# Patient Record
Sex: Female | Born: 1950
Health system: Southern US, Community
[De-identification: ages and names within clinical notes are randomized; demographics above are authoritative.]

## PROBLEM LIST (undated history)

## (undated) DIAGNOSIS — D8989 Other specified disorders involving the immune mechanism, not elsewhere classified: Secondary | ICD-10-CM

## (undated) DIAGNOSIS — I480 Paroxysmal atrial fibrillation: Secondary | ICD-10-CM

## (undated) DIAGNOSIS — N809 Endometriosis, unspecified: Secondary | ICD-10-CM

## (undated) DIAGNOSIS — F419 Anxiety disorder, unspecified: Secondary | ICD-10-CM

## (undated) DIAGNOSIS — C801 Malignant (primary) neoplasm, unspecified: Secondary | ICD-10-CM

## (undated) DIAGNOSIS — R5382 Chronic fatigue, unspecified: Secondary | ICD-10-CM

## (undated) DIAGNOSIS — G9332 Myalgic encephalomyelitis/chronic fatigue syndrome: Secondary | ICD-10-CM

## (undated) DIAGNOSIS — F32A Depression, unspecified: Secondary | ICD-10-CM

## (undated) DIAGNOSIS — G43909 Migraine, unspecified, not intractable, without status migrainosus: Secondary | ICD-10-CM

## (undated) DIAGNOSIS — I4719 Other supraventricular tachycardia: Secondary | ICD-10-CM

## (undated) DIAGNOSIS — M1990 Unspecified osteoarthritis, unspecified site: Secondary | ICD-10-CM

## (undated) DIAGNOSIS — I471 Supraventricular tachycardia: Secondary | ICD-10-CM

## (undated) DIAGNOSIS — E1140 Type 2 diabetes mellitus with diabetic neuropathy, unspecified: Secondary | ICD-10-CM

## (undated) DIAGNOSIS — C4490 Unspecified malignant neoplasm of skin, unspecified: Secondary | ICD-10-CM

## (undated) DIAGNOSIS — I10 Essential (primary) hypertension: Secondary | ICD-10-CM

## (undated) DIAGNOSIS — Z8781 Personal history of (healed) traumatic fracture: Secondary | ICD-10-CM

## (undated) DIAGNOSIS — E119 Type 2 diabetes mellitus without complications: Secondary | ICD-10-CM

## (undated) DIAGNOSIS — T7840XA Allergy, unspecified, initial encounter: Secondary | ICD-10-CM

## (undated) DIAGNOSIS — M797 Fibromyalgia: Secondary | ICD-10-CM

## (undated) DIAGNOSIS — K219 Gastro-esophageal reflux disease without esophagitis: Secondary | ICD-10-CM

## (undated) HISTORY — DX: Other supraventricular tachycardia: I47.19

## (undated) HISTORY — PX: BREAST LUMPECTOMY: SHX2

## (undated) HISTORY — DX: Malignant (primary) neoplasm, unspecified: C80.1

## (undated) HISTORY — DX: Other specified disorders involving the immune mechanism, not elsewhere classified: D89.89

## (undated) HISTORY — DX: Paroxysmal atrial fibrillation: I48.0

## (undated) HISTORY — DX: Supraventricular tachycardia: I47.1

## (undated) HISTORY — DX: Type 2 diabetes mellitus with diabetic neuropathy, unspecified: E11.40

## (undated) HISTORY — DX: Allergy, unspecified, initial encounter: T78.40XA

## (undated) HISTORY — DX: Unspecified malignant neoplasm of skin, unspecified: C44.90

## (undated) HISTORY — PX: ABDOMINAL HYSTERECTOMY: SHX81

## (undated) HISTORY — PX: DG  BONE DENSITY (ARMC HX): HXRAD1102

## (undated) HISTORY — DX: Essential (primary) hypertension: I10

## (undated) HISTORY — PX: PARTIAL HYSTERECTOMY: SHX80

## (undated) HISTORY — DX: Personal history of (healed) traumatic fracture: Z87.81

## (undated) HISTORY — DX: Fibromyalgia: M79.7

## (undated) HISTORY — DX: Depression, unspecified: F32.A

## (undated) HISTORY — DX: Anxiety disorder, unspecified: F41.9

## (undated) HISTORY — PX: BREAST BIOPSY: SHX20

## (undated) HISTORY — DX: Type 2 diabetes mellitus without complications: E11.9

## (undated) HISTORY — DX: Unspecified osteoarthritis, unspecified site: M19.90

## (undated) HISTORY — DX: Endometriosis, unspecified: N80.9

## (undated) HISTORY — DX: Gastro-esophageal reflux disease without esophagitis: K21.9

## (undated) HISTORY — DX: Myalgic encephalomyelitis/chronic fatigue syndrome: G93.32

## (undated) HISTORY — DX: Chronic fatigue, unspecified: R53.82

## (undated) HISTORY — DX: Migraine, unspecified, not intractable, without status migrainosus: G43.909

---

## 1997-10-28 ENCOUNTER — Ambulatory Visit (HOSPITAL_COMMUNITY): Admission: RE | Admit: 1997-10-28 | Discharge: 1997-10-28 | Payer: Self-pay | Admitting: Obstetrics and Gynecology

## 1998-08-26 ENCOUNTER — Other Ambulatory Visit: Admission: RE | Admit: 1998-08-26 | Discharge: 1998-08-26 | Payer: Self-pay | Admitting: Obstetrics and Gynecology

## 2000-09-27 HISTORY — PX: ABLATION: SHX5711

## 2000-12-07 ENCOUNTER — Encounter (HOSPITAL_COMMUNITY): Admission: RE | Admit: 2000-12-07 | Discharge: 2001-03-07 | Payer: Self-pay | Admitting: Family Medicine

## 2001-03-10 ENCOUNTER — Encounter (HOSPITAL_COMMUNITY): Admission: RE | Admit: 2001-03-10 | Discharge: 2001-04-26 | Payer: Self-pay | Admitting: Family Medicine

## 2001-04-27 ENCOUNTER — Encounter (HOSPITAL_COMMUNITY): Admission: RE | Admit: 2001-04-27 | Discharge: 2001-07-26 | Payer: Self-pay | Admitting: Family Medicine

## 2001-07-28 ENCOUNTER — Encounter (HOSPITAL_COMMUNITY): Admission: RE | Admit: 2001-07-28 | Discharge: 2001-08-26 | Payer: Self-pay | Admitting: Family Medicine

## 2001-08-01 ENCOUNTER — Encounter: Payer: Self-pay | Admitting: Obstetrics and Gynecology

## 2001-08-01 ENCOUNTER — Encounter: Admission: RE | Admit: 2001-08-01 | Discharge: 2001-08-01 | Payer: Self-pay | Admitting: Obstetrics and Gynecology

## 2001-10-23 ENCOUNTER — Encounter: Admission: RE | Admit: 2001-10-23 | Discharge: 2001-10-23 | Payer: Self-pay | Admitting: Obstetrics and Gynecology

## 2001-10-23 ENCOUNTER — Encounter: Payer: Self-pay | Admitting: Obstetrics and Gynecology

## 2001-11-01 ENCOUNTER — Other Ambulatory Visit: Admission: RE | Admit: 2001-11-01 | Discharge: 2001-11-01 | Payer: Self-pay | Admitting: Obstetrics and Gynecology

## 2001-11-06 ENCOUNTER — Encounter: Admission: RE | Admit: 2001-11-06 | Discharge: 2001-11-06 | Payer: Self-pay | Admitting: Obstetrics and Gynecology

## 2001-11-06 ENCOUNTER — Encounter: Payer: Self-pay | Admitting: Obstetrics and Gynecology

## 2001-11-17 ENCOUNTER — Encounter: Admission: RE | Admit: 2001-11-17 | Discharge: 2001-11-17 | Payer: Self-pay | Admitting: Family Medicine

## 2001-11-17 ENCOUNTER — Encounter: Payer: Self-pay | Admitting: Family Medicine

## 2002-04-03 ENCOUNTER — Encounter: Admission: RE | Admit: 2002-04-03 | Discharge: 2002-04-03 | Payer: Self-pay | Admitting: Obstetrics and Gynecology

## 2002-04-03 ENCOUNTER — Encounter: Payer: Self-pay | Admitting: Obstetrics and Gynecology

## 2003-04-25 ENCOUNTER — Encounter: Payer: Self-pay | Admitting: Family Medicine

## 2003-04-25 ENCOUNTER — Encounter: Admission: RE | Admit: 2003-04-25 | Discharge: 2003-04-25 | Payer: Self-pay | Admitting: Family Medicine

## 2003-04-29 ENCOUNTER — Ambulatory Visit (HOSPITAL_COMMUNITY): Admission: RE | Admit: 2003-04-29 | Discharge: 2003-04-29 | Payer: Self-pay | Admitting: Obstetrics and Gynecology

## 2003-04-29 ENCOUNTER — Encounter: Payer: Self-pay | Admitting: Obstetrics and Gynecology

## 2003-06-11 ENCOUNTER — Other Ambulatory Visit: Admission: RE | Admit: 2003-06-11 | Discharge: 2003-06-11 | Payer: Self-pay | Admitting: Obstetrics and Gynecology

## 2004-06-02 ENCOUNTER — Encounter: Admission: RE | Admit: 2004-06-02 | Discharge: 2004-06-02 | Payer: Self-pay | Admitting: Gastroenterology

## 2004-06-05 ENCOUNTER — Ambulatory Visit (HOSPITAL_COMMUNITY): Admission: RE | Admit: 2004-06-05 | Discharge: 2004-06-05 | Payer: Self-pay | Admitting: Gastroenterology

## 2004-07-22 ENCOUNTER — Other Ambulatory Visit: Admission: RE | Admit: 2004-07-22 | Discharge: 2004-07-22 | Payer: Self-pay | Admitting: Obstetrics and Gynecology

## 2004-08-03 ENCOUNTER — Ambulatory Visit (HOSPITAL_COMMUNITY): Admission: RE | Admit: 2004-08-03 | Discharge: 2004-08-03 | Payer: Self-pay | Admitting: Obstetrics and Gynecology

## 2004-09-01 ENCOUNTER — Encounter: Admission: RE | Admit: 2004-09-01 | Discharge: 2004-09-01 | Payer: Self-pay | Admitting: Obstetrics and Gynecology

## 2004-11-02 ENCOUNTER — Encounter: Admission: RE | Admit: 2004-11-02 | Discharge: 2004-11-02 | Payer: Self-pay | Admitting: Gastroenterology

## 2004-11-27 ENCOUNTER — Encounter: Admission: RE | Admit: 2004-11-27 | Discharge: 2004-11-27 | Payer: Self-pay | Admitting: *Deleted

## 2004-12-03 ENCOUNTER — Observation Stay (HOSPITAL_COMMUNITY): Admission: RE | Admit: 2004-12-03 | Discharge: 2004-12-04 | Payer: Self-pay | Admitting: *Deleted

## 2005-07-26 ENCOUNTER — Other Ambulatory Visit: Admission: RE | Admit: 2005-07-26 | Discharge: 2005-07-26 | Payer: Self-pay | Admitting: Obstetrics and Gynecology

## 2007-03-02 ENCOUNTER — Other Ambulatory Visit: Admission: RE | Admit: 2007-03-02 | Discharge: 2007-03-02 | Payer: Self-pay | Admitting: Obstetrics and Gynecology

## 2007-04-26 ENCOUNTER — Encounter: Admission: RE | Admit: 2007-04-26 | Discharge: 2007-04-26 | Payer: Self-pay | Admitting: Obstetrics and Gynecology

## 2008-04-26 ENCOUNTER — Encounter: Admission: RE | Admit: 2008-04-26 | Discharge: 2008-04-26 | Payer: Self-pay | Admitting: Obstetrics and Gynecology

## 2009-04-28 ENCOUNTER — Encounter: Admission: RE | Admit: 2009-04-28 | Discharge: 2009-04-28 | Payer: Self-pay | Admitting: Obstetrics and Gynecology

## 2009-05-14 ENCOUNTER — Other Ambulatory Visit: Admission: RE | Admit: 2009-05-14 | Discharge: 2009-05-14 | Payer: Self-pay | Admitting: Obstetrics and Gynecology

## 2009-07-24 ENCOUNTER — Encounter: Admission: RE | Admit: 2009-07-24 | Discharge: 2009-07-24 | Payer: Self-pay | Admitting: Family Medicine

## 2010-04-29 ENCOUNTER — Encounter: Admission: RE | Admit: 2010-04-29 | Discharge: 2010-04-29 | Payer: Self-pay | Admitting: Obstetrics and Gynecology

## 2010-05-08 ENCOUNTER — Encounter: Admission: RE | Admit: 2010-05-08 | Discharge: 2010-05-08 | Payer: Self-pay | Admitting: Obstetrics and Gynecology

## 2010-09-18 ENCOUNTER — Other Ambulatory Visit
Admission: RE | Admit: 2010-09-18 | Discharge: 2010-09-18 | Payer: Self-pay | Source: Home / Self Care | Admitting: Obstetrics and Gynecology

## 2011-02-12 NOTE — Cardiovascular Report (Signed)
NAME:  Sydney Ferguson, Sydney Ferguson NO.:  [id number]   MEDICAL RECORD NO.:  [id number]          PATIENT TYPE:  INP   LOCATION:  2871                         FACILITY:  [facility]   PHYSICIAN:  Janeece Riggers. Severiano Gilbert, M.D.    DATE OF BIRTH:  02-Sep-1951   DATE OF PROCEDURE:  12/03/2004  DATE OF DISCHARGE:                              CARDIAC CATHETERIZATION   PROCEDURES PERFORMED:  1.  Comprehensive electrophysiologic evaluation with arrhythmia induction      attempt.  2.  Left atrial pacing from coronary sinus.  3.  Programmed stimulation after infusion of isoproterenol.   PREPROCEDURE DIAGNOSIS:  Paroxysmal supraventricular tachycardia.   POSTPROCEDURE DIAGNOSIS:  Paroxysmal atrial fibrillation.   OPERATOR:  Mark E. Severiano Gilbert, M.D.   COMPLICATIONS:  None.   ESTIMATED BLOOD LOSS:  Less than 30 mL.   PROCEDURE IN DETAIL:  The patient was brought to the EP laboratory in a  fasted stated.  The patient's bilateral groins were prepped and draped in  usual manner.  The patient was connected to an electronic monitoring system  in normal manner.  One percent local lidocaine was infused subcutaneously  for local anesthesia.  Conscious sedation was obtained with intermittent  injections of midazolam and fentanyl.  Using a thin-wall needle technique  after appropriate local anesthesia was obtained, two 7 [place] sheaths were  placed into the left femoral vein and two 7 French sheaths were placed into  the right femoral vein.  Via these sheaths until continuous fluoroscopic  guidance, four intracardiac multiple electrode-containing recording  catheters were positioned.  A quadripolar catheter was positioned easily in  the high right atrium.  A hexapolar catheter was positioned easily along the  tricuspid annulus to record His' bundle electrode.  A quadripolar catheter  was easily positioned within the right ventricular apex.  Finally, a  decapolar deflectable curve catheter was easily positioned  within the  coronary sinus for left atrial recordings.  After determining appropriate  pacing stimulation thresholds, programmed electrical stimulation was  performed both at baseline, as well as after infusion of isoproterenol  necessary to increase the heart rate 50% above baseline.  The baseline  rhythm was sinus rhythm, cycle length 768 msec, PR interval 190, QRS  duration 115 with an incomplete right bundle branch block and the QT  interval was within normal limits.  The measured AH was 99 msec.  The  measured HV was 42 msec.  There was no evidence of ventricular  preexcitation.  At baseline, burst pacing from the right ventricular apex at  a cycle length of 600 msec resulted in [place] block.  Therefore, the block cycle  length retrograde was 600 msec.  Because of the prolonged block cycle length  at baseline, ventricular premature testing was not performed.  At baseline,  the antegrade block cycle length of the AV node was less than 600 msec.  The  fast pathway ERP was less than 250 msec off a drive train of 161 msec.  Further testing at baseline of AV node conduction property was not performed  secondary to repeated induction of paroxysmal atrial fibrillation.  Each  of  the episodes of atrial fibrillation were characterized by very good rate  control even though the patient has been off of her rate controlling  medicines and terminated spontaneously, the longest episode lasting  approximately 1-1.5 minutes.  During atrial premature testing, there was no  evidence of slow pathway conduction or an echo to suggest underlying dual AV  node physiology.  There was no evidence of antegrade accessory pathway  function.  Isoproterenol 1-2 mcg/min was infused to elevate the heart rate  from 60-70 beats to 100-115 beats per minute.  After isoproterenol infusion,  the retrograde block cycle length from the ventricle was less than 450 msec.  The retrograde effective refractory period of the AV node  was 300 msec off  of a drive train of 478 msec during isoproterenol infusion.  Retrograde  atrial activation was both midline and decremental.  During isoproterenol  infusion, burst atrial pacing down to a cycle length of approximately 310  msec before we saw antegrade block cycle length.  Using a drive train of 295  msec after infusion of isoproterenol, single atrial prematures failed to  reveal dual AV node physiology or initiate a reciprocating atrioventricular  tachycardia.  Because of concern of the possibility of the coronary sinus  catheter stressing the slow pathway, the catheter was repositioned within  the floor of the right atrium.  Multiple attempts to reinitiate a  tachycardia with atrial prematures failed to demonstrate either dual AV node  physiology or induce an arrhythmia.  The procedure was concluded.  All  catheters were removed.  Sheaths were removed and hemostasis obtained by  direct pressure without difficulty.   IMPRESSION:  1.  No inducible, ablatable sustained supraventricular tachycardia.  2.  Programmed electrical stimulation induced short episodes of paroxysmal      atrial fibrillation.  3.  Isoproterenol was not successful at inducing an ablatable arrhythmia.   PLAN:  The patient clinically does not have significant atrial fibrillation  burden.  She is under the age of 71.  Her recent echocardiogram revealed a  structurally normal heart.  She does not carry a diagnosis of diabetes,  hypertension or recent TIA or stroke.  Therefore, I think she can be  characterized as lone atrial fibrillation.  I agree with use of AV node rate  controlling medicines as tolerated to minimize her symptoms of palpitations.      MEP/MEDQ  D:  12/03/2004  T:  12/03/2004  Job:  [id number]

## 2011-02-12 NOTE — Op Note (Signed)
NAME:  Sydney Ferguson, Sydney Ferguson                       ACCOUNT NO.:  [id number]   MEDICAL RECORD NO.:  [id number]                   PATIENT TYPE:  AMB   LOCATION:  ENDO                                 FACILITY[facility]  [facility]   PHYSICIAN:  James L. Malon Kindle., M.D.          DATE OF BIRTH:  July 09, 1951   DATE OF PROCEDURE:  06/05/2004  DATE OF DISCHARGE:                                 OPERATIVE REPORT   PROCEDURE:  Esophagogastroduodenoscopy.   MEDICATIONS:  Cetacaine spray, fentanyl 87.5 mcg, Versed 8 mg IV.   INDICATIONS:  Persistent epigastric pain and dyspepsia.   DESCRIPTION OF PROCEDURE:  The procedure had been explained to the patient  and consent was obtained.  With the patient in the left lateral decubitus  position, the Olympus scope was inserted and advanced.  The stomach was  entered _________ scope passed.  The duodenum through the bulb and second  portion were seen well.  The scope was then withdrawn back into the stomach.  The duodenum and second portion were normal.  No ulceration or inflammation.  The stomach was carefully examined.  The antrum and body were normal.  The  fundus and cardia were seen well on the retroflexed view and were normal.  The GE junction was widely patent.  The distal esophagus was somewhat  reddened.  There was some slight redness to the distal esophagus.   ASSESSMENT:  Probable gastroesophageal reflux disease.  530.81.   PLAN:  Will treat the patient with Nexium b.i.d., give reflux sheet with  antireflux instructions.  See back in the office in 2-3 weeks.                                               James L. Malon Kindle., M.D.    Waldron Session  D:  06/05/2004  T:  06/06/2004  Job:  [id number]   cc:   Molly Maduro L. Foy Guadal[place]ara, M.D.  [address] [place]  [place] [address]  Fax: [phone]

## 2011-10-04 ENCOUNTER — Other Ambulatory Visit: Payer: Self-pay | Admitting: Obstetrics and Gynecology

## 2011-10-04 DIAGNOSIS — Z1231 Encounter for screening mammogram for malignant neoplasm of breast: Secondary | ICD-10-CM

## 2011-10-21 ENCOUNTER — Ambulatory Visit
Admission: RE | Admit: 2011-10-21 | Discharge: 2011-10-21 | Disposition: A | Discharge status: Home / Self Care | Payer: Managed Care, Other (non HMO) | Source: Ambulatory Visit | Attending: Obstetrics and Gynecology | Admitting: Obstetrics and Gynecology

## 2011-10-21 DIAGNOSIS — Z1231 Encounter for screening mammogram for malignant neoplasm of breast: Secondary | ICD-10-CM

## 2012-10-09 ENCOUNTER — Other Ambulatory Visit: Payer: Self-pay | Admitting: Obstetrics and Gynecology

## 2012-10-09 DIAGNOSIS — Z1231 Encounter for screening mammogram for malignant neoplasm of breast: Secondary | ICD-10-CM

## 2012-10-31 ENCOUNTER — Ambulatory Visit
Admission: RE | Admit: 2012-10-31 | Discharge: 2012-10-31 | Disposition: A | Discharge status: Home / Self Care | Payer: Managed Care, Other (non HMO) | Source: Ambulatory Visit | Attending: Obstetrics and Gynecology | Admitting: Obstetrics and Gynecology

## 2012-10-31 DIAGNOSIS — Z1231 Encounter for screening mammogram for malignant neoplasm of breast: Secondary | ICD-10-CM

## 2013-08-20 ENCOUNTER — Other Ambulatory Visit: Payer: Self-pay | Admitting: Cardiology

## 2013-08-22 ENCOUNTER — Other Ambulatory Visit: Payer: Self-pay | Admitting: Cardiology

## 2013-08-27 ENCOUNTER — Encounter: Payer: Self-pay | Admitting: General Surgery

## 2013-08-27 DIAGNOSIS — I480 Paroxysmal atrial fibrillation: Secondary | ICD-10-CM | POA: Insufficient documentation

## 2013-08-27 DIAGNOSIS — I471 Supraventricular tachycardia: Secondary | ICD-10-CM

## 2013-08-29 ENCOUNTER — Encounter: Payer: Self-pay | Admitting: General Surgery

## 2013-08-29 ENCOUNTER — Ambulatory Visit (INDEPENDENT_AMBULATORY_CARE_PROVIDER_SITE_OTHER): Payer: Managed Care, Other (non HMO) | Admitting: Cardiology

## 2013-08-29 ENCOUNTER — Encounter: Payer: Self-pay | Admitting: Cardiology

## 2013-08-29 VITALS — BP 136/89 | HR 87 | Ht 68.5 in | Wt 231.8 lb

## 2013-08-29 DIAGNOSIS — I4891 Unspecified atrial fibrillation: Secondary | ICD-10-CM

## 2013-08-29 DIAGNOSIS — R0602 Shortness of breath: Secondary | ICD-10-CM | POA: Insufficient documentation

## 2013-08-29 DIAGNOSIS — I471 Supraventricular tachycardia: Secondary | ICD-10-CM

## 2013-08-29 DIAGNOSIS — I480 Paroxysmal atrial fibrillation: Secondary | ICD-10-CM

## 2013-08-29 NOTE — Progress Notes (Signed)
[address] [place], [place]  [address] Phone: [phone] Fax:  [phone]  Date:  08/29/2013   ID:  Oni, Dietzman 01/05/1951, MRN [id number]  PCP:  Lenora Boys, MD  Cardiologist:  Armanda Magic, MD     History of Present Illness: Sydney Ferguson is a 62 y.o. female with a history of PAF who presents today for followup.  She is doing very well.  She occasionally has a breakthrough of palpitations but they are very short liked and only last a few seconds.  She denies any chest pain.  She does notice occasional DOE but she has gained weight (25 pounds) since I saw her last.  She denies any dizziness or syncope.  She has occasionaly LE edema   Wt Readings from Last 3 Encounters:  08/29/13 231 lb 12.8 oz (105.144 kg)  08/27/13 206 lb (93.441 kg)     Past Medical History  Diagnosis Date  . Migraines   . Endometriosis   . CFIDS (chronic fatigue and immune dysfunction syndrome)   . Fibromyalgia   . Atrial tachycardia   . PAF (paroxysmal atrial fibrillation)     s/p failed ablation 2002    Current Outpatient Prescriptions  Medication Sig Dispense Refill  . Biotin 10 MG CAPS Take 1 capsule by mouth daily.      . Cetirizine HCl (ZYRTEC ALLERGY) 10 MG CAPS Take 1 capsule by mouth as needed.       . ciclesonide (OMNARIS) 50 MCG/ACT nasal spray Place 2 sprays into both nostrils as needed for allergies.      . clonazePAM (KLONOPIN) 0.5 MG tablet Take 0.5 mg by mouth at bedtime.      . CYCLOBENZAPRINE HCL PO Take 1 tablet by mouth at bedtime. Pt is unsure of strength      . flecainide (TAMBOCOR) 100 MG tablet Take 100 mg by mouth 2 (two) times daily.      . metoprolol succinate (TOPROL-XL) 25 MG 24 hr tablet TAKE 1/2 A TABLET BY MOUTH ONCE A DAY  15 tablet  1  . zolmitriptan (ZOMIG) 5 MG tablet Take 5 mg by mouth as needed for migraine.       No current facility-administered medications for this visit.    Allergies:    Allergies  Allergen Reactions  .  Codeine Nausea Only    Social History:  The patient  reports that she quit smoking about 20 years ago. Her smoking use included Cigarettes. She smoked 0.25 packs per day. She has never used smokeless tobacco. She reports that she drinks alcohol. She reports that she does not use illicit drugs.   Family History:  The patient's family history is not on file.   ROS:  Please see the history of present illness.      All other systems reviewed and negative.   PHYSICAL EXAM: VS:  BP 136/89  Pulse 87  Ht 5' 8.5" (1.74 m)  Wt 231 lb 12.8 oz (105.144 kg)  BMI 34.73 kg/m2 Well nourished, well developed, in no acute distress HEENT: normal Neck: no JVD Cardiac:  normal S1, S2; RRR; no murmur Lungs:  clear to auscultation bilaterally, no wheezing, rhonchi or rales Abd: soft, nontender, no hepatomegaly Ext: no edema Skin: warm and dry Neuro:  CNs 2-12 intact, no focal abnormalities noted  EKG:  NSR with IRBBB     ASSESSMENT AND PLAN:  1. PAF well controlled on current medical regimen  - continue metoprolol/Flecainide 2.  DOE most likely secondary to recent weight gain but need to rule out ischemia given that she is postmenopausal  - I will get a stress myoview to rule out ischemia  - check 2D echo to reassess LVF  Followup with me in 1 year  Signed, Armanda Magic, MD 08/29/2013 2:52 PM

## 2013-08-29 NOTE — Patient Instructions (Signed)
Your physician recommends that you continue on your current medications as directed. Please refer to the Current Medication list given to you today.  Your physician has requested that you have an echocardiogram. Echocardiography is a painless test that uses sound waves to create images of your heart. It provides your doctor with information about the size and shape of your heart and how well your heart's chambers and valves are working. This procedure takes approximately one hour. There are no restrictions for this procedure.  Your physician has requested that you have en exercise stress myoview. For further information please visit [email or web address] Please follow instruction sheet, as given.  Your physician wants you to follow-up in: 1 Year with Dr Sherlyn Lick will receive a reminder letter in the mail two months in advance. If you don't receive a letter, please call our office to schedule the follow-up appointment.

## 2013-08-31 ENCOUNTER — Ambulatory Visit: Payer: Managed Care, Other (non HMO) | Admitting: Cardiology

## 2013-09-01 ENCOUNTER — Other Ambulatory Visit: Payer: Self-pay | Admitting: Cardiology

## 2013-09-18 ENCOUNTER — Ambulatory Visit (HOSPITAL_BASED_OUTPATIENT_CLINIC_OR_DEPARTMENT_OTHER): Payer: Managed Care, Other (non HMO) | Admitting: Radiology

## 2013-09-18 ENCOUNTER — Ambulatory Visit (HOSPITAL_COMMUNITY): Payer: Managed Care, Other (non HMO) | Attending: Cardiology | Admitting: Radiology

## 2013-09-18 ENCOUNTER — Encounter: Payer: Self-pay | Admitting: Cardiology

## 2013-09-18 VITALS — BP 158/90 | Ht 69.0 in | Wt 227.0 lb

## 2013-09-18 DIAGNOSIS — Z87891 Personal history of nicotine dependence: Secondary | ICD-10-CM | POA: Insufficient documentation

## 2013-09-18 DIAGNOSIS — Z8249 Family history of ischemic heart disease and other diseases of the circulatory system: Secondary | ICD-10-CM | POA: Insufficient documentation

## 2013-09-18 DIAGNOSIS — I471 Supraventricular tachycardia: Secondary | ICD-10-CM

## 2013-09-18 DIAGNOSIS — R002 Palpitations: Secondary | ICD-10-CM | POA: Insufficient documentation

## 2013-09-18 DIAGNOSIS — I4891 Unspecified atrial fibrillation: Secondary | ICD-10-CM

## 2013-09-18 DIAGNOSIS — R0602 Shortness of breath: Secondary | ICD-10-CM

## 2013-09-18 DIAGNOSIS — I480 Paroxysmal atrial fibrillation: Secondary | ICD-10-CM

## 2013-09-18 DIAGNOSIS — R0609 Other forms of dyspnea: Secondary | ICD-10-CM

## 2013-09-18 DIAGNOSIS — R0989 Other specified symptoms and signs involving the circulatory and respiratory systems: Secondary | ICD-10-CM | POA: Insufficient documentation

## 2013-09-18 MED ORDER — TECHNETIUM TC 99M SESTAMIBI GENERIC - CARDIOLITE
33.0000 | Freq: Once | INTRAVENOUS | Status: AC | PRN
  Administered 2013-09-18: 33 via INTRAVENOUS

## 2013-09-18 MED ORDER — TECHNETIUM TC 99M SESTAMIBI GENERIC - CARDIOLITE
11.0000 | Freq: Once | INTRAVENOUS | Status: AC | PRN
  Administered 2013-09-18: 11 via INTRAVENOUS

## 2013-09-18 NOTE — Progress Notes (Signed)
  [facility] SITE 3 NUCLEAR MED [address] [place], [place] [address] [phone]    Cardiology Nuclear Med Study  Sydney Ferguson is a 62 y.o. female     MRN : [id number]     DOB: 1951-08-11  Procedure Date: 09/18/2013  Nuclear Med Background Indication for Stress Test:  Evaluation for Ischemia History:  No CAD HX EAGLE ECHO NL per pt, MPI: 10 yrs ago, PAF Failed Ablation 2002 Cardiac Risk Factors: Family History - CAD and History of Smoking  Symptoms:  DOE and Palpitations   Nuclear Pre-Procedure Caffeine/Decaff Intake:  8:00am NPO After: 8:00am   Lungs:  clear O2 Sat: 98% on room air. IV 0.9% NS with Angio Cath:  22g  IV Site: R Hand  IV Started by:  Cathlyn Parsons, RN  Chest Size (in):  36 Cup Size: B  Height: 5\' 9"  (1.753 m)  Weight:  227 lb (102.967 kg)  BMI:  Body mass index is 33.51 kg/(m^2). Tech Comments:  No Toprol x 36 hrs    Nuclear Med Study 1 or 2 day study: 1 day  Stress Test Type:  Stress  Reading MD: Armanda Magic, MD  Order Authorizing Provider:  Gevena Cotton  Resting Radionuclide: Technetium 63m Sestamibi  Resting Radionuclide Dose: 11.0 mCi   Stress Radionuclide:  Technetium 98m Sestamibi  Stress Radionuclide Dose: 33.0 mCi           Stress Protocol Rest HR: 83 Stress HR: 144  Rest BP: 158/90 Stress BP: 215/54  Exercise Time (min): 4:15 METS: 6.10   Predicted Max HR: 159 bpm % Max HR: 90.57 bpm Rate Pressure Product: 21308   Dose of Adenosine (mg):  n/a Dose of Lexiscan: n/a mg  Dose of Atropine (mg): n/a Dose of Dobutamine: n/a mcg/kg/min (at max HR)  Stress Test Technologist: Milana Na, EMT-P  Nuclear Technologist:  Domenic Polite, CNMT     Rest Procedure:  Myocardial perfusion imaging was performed at rest 45 minutes following the intravenous administration of Technetium 71m Sestamibi. Rest ECG: NSR - Normal EKG  Stress Procedure:  The patient exercised on the treadmill utilizing the Bruce Protocol for  4:15 minutes. The patient stopped due to fatigue,sob, and denied any chest pain.  Technetium 69m Sestamibi was injected at peak exercise and myocardial perfusion imaging was performed after a brief delay. Stress ECG: No significant change from baseline ECG  QPS Raw Data Images:  Normal; no motion artifact; normal heart/lung ratio. Stress Images:  Normal homogeneous uptake in all areas of the myocardium. Rest Images:  Normal homogeneous uptake in all areas of the myocardium. Subtraction (SDS):  No evidence of ischemia. Transient Ischemic Dilatation (Normal <1.22):  0.90 Lung/Heart Ratio (Normal <0.45):  0.22  Quantitative Gated Spect Images QGS EDV:  70 ml QGS ESV:  22 ml  Impression Exercise Capacity:  Fair exercise capacity. BP Response:  Hypertensive blood pressure response. Clinical Symptoms:  There is dyspnea. ECG Impression:  No significant ST segment change suggestive of ischemia. Comparison with Prior Nuclear Study: No images to compare  Overall Impression:  Normal stress nuclear study.  LV Ejection Fraction: 68%.  LV Wall Motion:  NL LV Function; NL Wall Motion   Signed: Armanda Magic, MD 09/18/2013

## 2013-09-18 NOTE — Progress Notes (Signed)
Echocardiogram performed.  

## 2013-09-21 ENCOUNTER — Other Ambulatory Visit: Payer: Self-pay | Admitting: General Surgery

## 2013-09-21 MED ORDER — METOPROLOL SUCCINATE ER 25 MG PO TB24: 12.5000 mg | ORAL_TABLET | Freq: Every day | ORAL | Status: DC

## 2013-09-21 NOTE — Telephone Encounter (Signed)
PT is aware rx has been sent in

## 2013-12-05 ENCOUNTER — Other Ambulatory Visit: Payer: Self-pay

## 2013-12-05 MED ORDER — FLECAINIDE ACETATE 100 MG PO TABS: 100.0000 mg | ORAL_TABLET | Freq: Two times a day (BID) | ORAL | Status: DC

## 2013-12-18 ENCOUNTER — Other Ambulatory Visit: Payer: Self-pay

## 2013-12-18 DIAGNOSIS — Z1231 Encounter for screening mammogram for malignant neoplasm of breast: Secondary | ICD-10-CM

## 2014-01-09 ENCOUNTER — Ambulatory Visit: Payer: Managed Care, Other (non HMO)

## 2014-01-23 ENCOUNTER — Ambulatory Visit: Payer: Managed Care, Other (non HMO)

## 2014-02-04 ENCOUNTER — Ambulatory Visit
Admission: RE | Admit: 2014-02-04 | Discharge: 2014-02-04 | Disposition: A | Discharge status: Home / Self Care | Payer: Managed Care, Other (non HMO) | Source: Ambulatory Visit

## 2014-02-04 ENCOUNTER — Encounter (INDEPENDENT_AMBULATORY_CARE_PROVIDER_SITE_OTHER): Payer: Self-pay

## 2014-02-04 DIAGNOSIS — Z1231 Encounter for screening mammogram for malignant neoplasm of breast: Secondary | ICD-10-CM

## 2014-07-03 ENCOUNTER — Encounter: Payer: Self-pay | Admitting: Cardiology

## 2014-08-20 ENCOUNTER — Other Ambulatory Visit: Payer: Self-pay | Admitting: Obstetrics & Gynecology

## 2014-08-20 DIAGNOSIS — N63 Unspecified lump in unspecified breast: Secondary | ICD-10-CM

## 2014-08-29 ENCOUNTER — Ambulatory Visit (INDEPENDENT_AMBULATORY_CARE_PROVIDER_SITE_OTHER): Payer: Managed Care, Other (non HMO) | Admitting: Cardiology

## 2014-08-29 ENCOUNTER — Encounter: Payer: Self-pay | Admitting: Cardiology

## 2014-08-29 VITALS — BP 128/86 | HR 83 | Ht 69.0 in | Wt 224.0 lb

## 2014-08-29 DIAGNOSIS — R0602 Shortness of breath: Secondary | ICD-10-CM

## 2014-08-29 DIAGNOSIS — I480 Paroxysmal atrial fibrillation: Secondary | ICD-10-CM

## 2014-08-29 MED ORDER — FLECAINIDE ACETATE 100 MG PO TABS: 100.0000 mg | ORAL_TABLET | Freq: Two times a day (BID) | ORAL | Status: DC

## 2014-08-29 NOTE — Patient Instructions (Signed)
Your physician recommends that you continue on your current medications as directed. Please refer to the Current Medication list given to you today.  Your physician wants you to follow-up in: 1 year with Dr. Turner. You will receive a reminder letter in the mail two months in advance. If you don't receive a letter, please call our office to schedule the follow-up appointment.  

## 2014-08-29 NOTE — Progress Notes (Signed)
[address] [place], [place]  [address] Phone: [phone] Fax:  [phone]  Date:  08/29/2014   ID:  Gerrianne Scaleancy H Kirchoff, DOB 1951/08/28, MRN [id number]  PCP:  Lenora BoysFRIED, ROBERT L, MD  Cardiologist:  Armanda Magicraci Turner, MD    History of Present Illness: Sydney Ferguson is a 63 y.o. female with a history of PAF who presents today for followup.She denies any chest pain. She denies any dizziness or syncope. She has occasionaly LE edema when she flys.  She occasionally has a few palpitations that are very short lived for a few seconds.  She has chronic SOB secondary to weight gain and stress test and echo earlier this year were normal.      Wt Readings from Last 3 Encounters:  08/29/14 224 lb (101.606 kg)  09/18/13 227 lb (102.967 kg)  08/29/13 231 lb 12.8 oz (105.144 kg)     Past Medical History  Diagnosis Date  . Migraines   . Endometriosis   . CFIDS (chronic fatigue and immune dysfunction syndrome)   . Fibromyalgia   . Atrial tachycardia   . PAF (paroxysmal atrial fibrillation)     s/p failed ablation 2002    Current Outpatient Prescriptions  Medication Sig Dispense Refill  . ALPRAZolam (XANAX) 0.5 MG tablet 0.5 mg at bedtime as needed.   1  . Biotin 10 MG CAPS Take 1 capsule by mouth daily.    . Cetirizine HCl (ZYRTEC ALLERGY) 10 MG CAPS Take 1 capsule by mouth as needed.     . ciclesonide (OMNARIS) 50 MCG/ACT nasal spray Place 2 sprays into both nostrils as needed for allergies.    . clonazePAM (KLONOPIN) 0.5 MG tablet Take 0.5 mg by mouth at bedtime.    . CYCLOBENZAPRINE HCL PO Take 1 tablet by mouth at bedtime. Pt is unsure of strength    . esomeprazole (NEXIUM) 20 MG capsule Take 20 mg by mouth 2 (two) times daily before a meal.     . flecainide (TAMBOCOR) 100 MG tablet Take 1 tablet (100 mg total) by mouth 2 (two) times daily. 60 tablet 6  . metoprolol succinate (TOPROL-XL) 25 MG 24 hr tablet Take 0.5 tablets (12.5 mg total) by mouth daily. 45 tablet 3  .  zolmitriptan (ZOMIG) 5 MG tablet Take 5 mg by mouth as needed for migraine.     No current facility-administered medications for this visit.    Allergies:    Allergies  Allergen Reactions  . Codeine Nausea Only    Social History:  The patient  reports that she quit smoking about 21 years ago. Her smoking use included Cigarettes. She smoked 0.25 packs per day. She has never used smokeless tobacco. She reports that she drinks alcohol. She reports that she does not use illicit drugs.   Family History:  The patient's family history includes Arrhythmia in her father; Dementia in her mother; Diabetes in her brother.   ROS:  Please see the history of present illness.      All other systems reviewed and negative.   PHYSICAL EXAM: VS:  BP 128/86 mmHg  Pulse 83  Ht 5\' 9"  (1.753 m)  Wt 224 lb (101.606 kg)  BMI 33.06 kg/m2 Well nourished, well developed, in no acute distress HEENT: normal Neck: no JVD Cardiac:  normal S1, S2; RRR; no murmur Lungs:  clear to auscultation bilaterally, no wheezing, rhonchi or rales Abd: soft, nontender, no hepatomegaly Ext: no edema Skin: warm and dry Neuro:  CNs 2-12  intact, no focal abnormalities noted  EKG:     NSR with no ST changes  ASSESSMENT AND PLAN:  1. PAF well controlled on current medical regimen.  CHADS2VASC Score is 1 (female) therefore continue ASA - continue metoprolol/Flecainide  - Baby ASA 81mg  daily   2.  SOB due to weight gain with no change since I saw her last - normal stress test and echo earlier this year  Followup with me in 1 year   Signed, Armanda Magicraci Turner, MD [facility] 08/29/2014 9:40 AM

## 2014-09-04 ENCOUNTER — Other Ambulatory Visit: Payer: Self-pay | Admitting: Cardiology

## 2014-09-04 ENCOUNTER — Ambulatory Visit
Admission: RE | Admit: 2014-09-04 | Discharge: 2014-09-04 | Disposition: A | Discharge status: Home / Self Care | Payer: Managed Care, Other (non HMO) | Source: Ambulatory Visit | Attending: Obstetrics & Gynecology | Admitting: Obstetrics & Gynecology

## 2014-09-04 DIAGNOSIS — N63 Unspecified lump in unspecified breast: Secondary | ICD-10-CM

## 2014-09-16 ENCOUNTER — Other Ambulatory Visit: Payer: Self-pay | Admitting: *Deleted

## 2014-09-16 MED ORDER — METOPROLOL SUCCINATE ER 25 MG PO TB24: 12.5000 mg | ORAL_TABLET | Freq: Every day | ORAL | Status: DC

## 2015-06-11 ENCOUNTER — Encounter: Payer: Self-pay | Admitting: Cardiology

## 2015-07-04 ENCOUNTER — Other Ambulatory Visit: Payer: Self-pay

## 2015-07-04 DIAGNOSIS — Z1231 Encounter for screening mammogram for malignant neoplasm of breast: Secondary | ICD-10-CM

## 2015-07-30 ENCOUNTER — Encounter: Payer: Self-pay | Admitting: Cardiology

## 2015-08-30 ENCOUNTER — Other Ambulatory Visit: Payer: Self-pay | Admitting: Cardiology

## 2015-09-09 ENCOUNTER — Ambulatory Visit: Payer: Managed Care, Other (non HMO) | Admitting: Cardiology

## 2015-09-21 ENCOUNTER — Encounter (HOSPITAL_COMMUNITY): Payer: Self-pay | Admitting: Emergency Medicine

## 2015-09-21 ENCOUNTER — Emergency Department (HOSPITAL_COMMUNITY)
Admission: EM | Admit: 2015-09-21 | Discharge: 2015-09-21 | Disposition: A | Discharge status: Home / Self Care | Payer: BLUE CROSS/BLUE SHIELD | Attending: Emergency Medicine | Admitting: Emergency Medicine

## 2015-09-21 ENCOUNTER — Emergency Department (HOSPITAL_COMMUNITY): Payer: BLUE CROSS/BLUE SHIELD

## 2015-09-21 DIAGNOSIS — G43909 Migraine, unspecified, not intractable, without status migrainosus: Secondary | ICD-10-CM | POA: Insufficient documentation

## 2015-09-21 DIAGNOSIS — R05 Cough: Secondary | ICD-10-CM | POA: Diagnosis present

## 2015-09-21 DIAGNOSIS — I480 Paroxysmal atrial fibrillation: Secondary | ICD-10-CM | POA: Insufficient documentation

## 2015-09-21 DIAGNOSIS — Z79899 Other long term (current) drug therapy: Secondary | ICD-10-CM | POA: Diagnosis not present

## 2015-09-21 DIAGNOSIS — Z8742 Personal history of other diseases of the female genital tract: Secondary | ICD-10-CM | POA: Diagnosis not present

## 2015-09-21 DIAGNOSIS — Z87891 Personal history of nicotine dependence: Secondary | ICD-10-CM | POA: Insufficient documentation

## 2015-09-21 DIAGNOSIS — J40 Bronchitis, not specified as acute or chronic: Secondary | ICD-10-CM | POA: Insufficient documentation

## 2015-09-21 DIAGNOSIS — Z7951 Long term (current) use of inhaled steroids: Secondary | ICD-10-CM | POA: Diagnosis not present

## 2015-09-21 MED ORDER — HYDROCODONE-ACETAMINOPHEN 5-325 MG PO TABS: ORAL_TABLET | ORAL | Status: DC

## 2015-09-21 MED ORDER — HYDROCODONE-ACETAMINOPHEN 5-325 MG PO TABS
1.0000 | ORAL_TABLET | Freq: Once | ORAL | Status: AC
  Administered 2015-09-21: 1 via ORAL
  Filled 2015-09-21: qty 1

## 2015-09-21 NOTE — ED Provider Notes (Signed)
CSN: [id number]     Arrival date & time 09/21/15  1203 History  By signing my name below, I, [facility], attest that this documentation has been prepared under the direction and in the presence of [employer], PA-C. Electronically Signed: Randell Patient, ED Scribe. 09/21/2015. 1:03 PM.   Chief Complaint  Patient presents with  . Cough   The history is provided by the patient. No language interpreter was used.   HPI Comments: RUTHELLEN TIPPY is a 64 y.o. female with an hx of CFIDS, bronchitis, and fibromyalgia presents to the Emergency Department complaining of a moderate, intermittent, gradually worsening cough, productive of yellow-green sputum, onset 1 week ago. Patient endorses associated back pain, sleep disturbance secondary to cough, mild nasal drainage, ear pain secondary to cough, sore throat secondary to cough, muscle aches, and subjective fever (TMAX 99.4). She notes that her muscle aches and back pain are similar in quality to the baseline pain that presents with her fibromyalgia. She has taken Mucinex and Alka-Seltzer Plus without relief. Per patient, she reports sick contact with her husband who has similar symptoms but notes that he had greater nasal drainage. Pt denies h/o DVT/PE, calf pain or leg swelling, hemoptysis, recent immobilization, cancer/chemotherapy in the last 6 months, exogenous estrogen.    Past Medical History  Diagnosis Date  . Migraines   . Endometriosis   . CFIDS (chronic fatigue and immune dysfunction syndrome)   . Fibromyalgia   . Atrial tachycardia (HCC)   . PAF (paroxysmal atrial fibrillation) (HCC)     s/p failed ablation 2002   Past Surgical History  Procedure Laterality Date  . Ablation  2002    unsucessful   Family History  Problem Relation Age of Onset  . Dementia Mother   . Arrhythmia Father   . Diabetes Brother    Social History  Substance Use Topics  . Smoking status: Former Smoker -- 0.25 packs/day    Types:  Cigarettes    Quit date: 09/27/1992  . Smokeless tobacco: Never Used  . Alcohol Use: Yes     Comment: rare   OB History    No data available     Review of Systems A complete 10 system review of systems was obtained and all systems are negative except as noted in the HPI and PMH.    Allergies  Codeine  Home Medications   Prior to Admission medications   Medication Sig Start Date End Date Taking? Authorizing Provider  ALPRAZolam Prudy Feeler) 0.5 MG tablet Take 0.5 mg by mouth daily as needed for anxiety.  06/13/14  Yes Historical Provider, MD  Biotin 10 MG CAPS Take 1 capsule by mouth daily.   Yes Historical Provider, MD  ciclesonide (OMNARIS) 50 MCG/ACT nasal spray Place 2 sprays into both nostrils as needed for allergies.   Yes Historical Provider, MD  clonazePAM (KLONOPIN) 0.5 MG tablet Take 0.5 mg by mouth at bedtime.   Yes Historical Provider, MD  cyclobenzaprine (FLEXERIL) 10 MG tablet TAKE 1 TABLET BY MOUTH AT BEDTIME 08/11/15  Yes Historical Provider, MD  DULoxetine (CYMBALTA) 60 MG capsule TAKE 1 CAPSULE ONCE A DAY 09/07/15  Yes Historical Provider, MD  esomeprazole (NEXIUM) 20 MG capsule Take 20 mg by mouth 2 (two) times daily before a meal.    Yes Historical Provider, MD  flecainide (TAMBOCOR) 100 MG tablet TAKE 1 TABLET (100 MG TOTAL) BY MOUTH 2 (TWO) TIMES DAILY. 09/01/15  Yes Quintella Reichert, MD  gabapentin (NEURONTIN) 100 MG capsule Take  100 mg by mouth 3 (three) times daily. 08/29/15  Yes Historical Provider, MD  metoprolol succinate (TOPROL-XL) 25 MG 24 hr tablet Take 0.5 tablets (12.5 mg total) by mouth daily. 09/16/14  Yes Quintella Reichert, MD  Vitamin D, Ergocalciferol, (DRISDOL) 50000 UNITS CAPS capsule TAKE 1 CAPSULE BY MOUTH WEEKLY 07/25/15  Yes Historical Provider, MD  zolmitriptan (ZOMIG) 5 MG tablet Take 5 mg by mouth daily as needed for migraine.    Yes Historical Provider, MD  HYDROcodone-acetaminophen (NORCO/VICODIN) 5-325 MG tablet Take 1-2 tablets by mouth every 6  hours as needed for pain and/or cough. 09/21/15   Neyland Pettengill, PA-C   BP 146/86 mmHg  Pulse 103  Temp(Src) 98.3 F (36.8 C) (Oral)  Resp 20  SpO2 96% Physical Exam  Constitutional: She is oriented to person, place, and time. She appears well-developed and well-nourished. No distress.  HENT:  Head: Normocephalic.  Nose: Mucosal edema and rhinorrhea present.  Mouth/Throat: Uvula is midline, oropharynx is clear and moist and mucous membranes are normal. No oropharyngeal exudate or posterior oropharyngeal edema.  Eyes: Conjunctivae are normal.  Neck: Normal range of motion. No JVD present. No tracheal deviation present.  Cardiovascular: Normal rate, regular rhythm and intact distal pulses.   Pulmonary/Chest: Effort normal and breath sounds normal. No stridor. No respiratory distress. She has no wheezes. She has no rales. She exhibits no tenderness.  Abdominal: Soft. She exhibits no distension and no mass. There is no tenderness. There is no rebound and no guarding.  Musculoskeletal: Normal range of motion. She exhibits no edema or tenderness.  No calf asymmetry, superficial collaterals, palpable cords, edema, Homans sign negative bilaterally.    Neurological: She is alert and oriented to person, place, and time.  Skin: Skin is warm. She is not diaphoretic.  Psychiatric: She has a normal mood and affect.  Nursing note and vitals reviewed.   ED Course  Procedures   DIAGNOSTIC STUDIES: Oxygen Saturation is 96% on RA, adequate by my interpretation.    COORDINATION OF CARE: 12:50 PM Discussed results of chest x-ray. Advised pt to rest, increase fluid intake, and take Motrin. Will prescribe Vicodin. Discussed treatment plan with pt at bedside and pt agreed to plan.  Labs Review Labs Reviewed - No data to display  Imaging Review Dg Chest 2 View  09/21/2015  CLINICAL DATA:  Cold symptoms for 1 week EXAM: CHEST  2 VIEW COMPARISON:  01/15/2014 FINDINGS: Cardiomediastinal silhouette  is stable. Calcified granuloma left midlung laterally is stable. No acute infiltrate or pleural effusion. No pulmonary edema. Stable mild degenerative changes thoracic spine. Stable minimal compression deformity mid thoracic spine. IMPRESSION: No active cardiopulmonary disease. Electronically Signed   By: Natasha Mead M.D.   On: 09/21/2015 12:42   I have personally reviewed and evaluated these images and lab results as part of my medical decision-making.   EKG Interpretation None      MDM   Final diagnoses:  Bronchitis    Filed Vitals:   09/21/15 1214  BP: 146/86  Pulse: 103  Temp: 98.3 F (36.8 C)  TempSrc: Oral  Resp: 20  SpO2: 96%    Medications  HYDROcodone-acetaminophen (NORCO/VICODIN) 5-325 MG per tablet 1 tablet (1 tablet Oral Given 09/21/15 1315)    DAMILOLA FLAMM is 64 y.o. female presenting with cough and exacerbation of her chronic fibromyalgia pain. Lung sounds are clear to auscultation, she is saturating well on room air. There is a mild tachycardia I doubt that this is a PE  considering her rhinorrhea and upper respiratory symptoms and lack of physical exam consistent with DVT. Chest x-ray is without signs of infiltrates, likely postnasal drip secondary to upper respiratory infection. Patient states she's having difficulty sleeping, will give Vicodin for comfort, advise her to follow closely with primary care.  Evaluation does not show pathology that would require ongoing emergent intervention or inpatient treatment. Pt is hemodynamically stable and mentating appropriately. Discussed findings and plan with patient/guardian, who agrees with care plan. All questions answered. Return precautions discussed and outpatient follow up given.   Discharge Medication List as of 09/21/2015  1:03 PM    START taking these medications   Details  HYDROcodone-acetaminophen (NORCO/VICODIN) 5-325 MG tablet Take 1-2 tablets by mouth every 6 hours as needed for pain and/or cough.,  Print         I personally performed the services described in this documentation, which was scribed in my presence. The recorded information has been reviewed and is accurate.   Wynetta Emeryicole Akshita Italiano, PA-C 09/21/15 1358  Vanetta MuldersScott Zackowski, MD 09/21/15 1444

## 2015-09-21 NOTE — Discharge Instructions (Signed)
Use nasal saline (you can try [employer] Simply Saline) at least 4 times a day, use saline 5-10 minutes before using the fluticasone (flonase) nasal spray  Do not use Afrin (Oxymetazoline)  Rest, wash hands frequently  and drink plenty of water.  You may try counter medication such as Mucinex or Sudafed decongestant.  Take vicodin for breakthrough pain, do not drink alcohol, drive, care for children or do other critical tasks while taking vicodin.  Please follow with your primary care doctor in the next 2 days for a check-up. They must obtain records for further management.   Do not hesitate to return to the Emergency Department for any new, worsening or concerning symptoms.   Upper Respiratory Infection, Adult Most upper respiratory infections (URIs) are a viral infection of the air passages leading to the lungs. A URI affects the nose, throat, and upper air passages. The most common type of URI is nasopharyngitis and is typically referred to as "the common cold." URIs run their course and usually go away on their own. Most of the time, a URI does not require medical attention, but sometimes a bacterial infection in the upper airways can follow a viral infection. This is called a secondary infection. Sinus and middle ear infections are common types of secondary upper respiratory infections. Bacterial pneumonia can also complicate a URI. A URI can worsen asthma and chronic obstructive pulmonary disease (COPD). Sometimes, these complications can require emergency medical care and may be life threatening.  CAUSES Almost all URIs are caused by viruses. A virus is a type of germ and can spread from one person to another.  RISKS FACTORS You may be at risk for a URI if:   You smoke.   You have chronic heart or lung disease.  You have a weakened defense (immune) system.   You are very young or very old.   You have nasal allergies or asthma.  You work in crowded or poorly ventilated  areas.  You work in health care facilities or schools. SIGNS AND SYMPTOMS  Symptoms typically develop 2-3 days after you come in contact with a cold virus. Most viral URIs last 7-10 days. However, viral URIs from the influenza virus (flu virus) can last 14-18 days and are typically more severe. Symptoms may include:   Runny or stuffy (congested) nose.   Sneezing.   Cough.   Sore throat.   Headache.   Fatigue.   Fever.   Loss of appetite.   Pain in your forehead, behind your eyes, and over your cheekbones (sinus pain).  Muscle aches.  DIAGNOSIS  Your health care provider may diagnose a URI by:  Physical exam.  Tests to check that your symptoms are not due to another condition such as:  Strep throat.  Sinusitis.  Pneumonia.  Asthma. TREATMENT  A URI goes away on its own with time. It cannot be cured with medicines, but medicines may be prescribed or recommended to relieve symptoms. Medicines may help:  Reduce your fever.  Reduce your cough.  Relieve nasal congestion. HOME CARE INSTRUCTIONS   Take medicines only as directed by your health care provider.   Gargle warm saltwater or take cough drops to comfort your throat as directed by your health care provider.  Use a warm mist humidifier or inhale steam from a shower to increase air moisture. This may make it easier to breathe.  Drink enough fluid to keep your urine clear or pale yellow.   Eat soups and other  clear broths and maintain good nutrition.   Rest as needed.   Return to work when your temperature has returned to normal or as your health care provider advises. You may need to stay home longer to avoid infecting others. You can also use a face mask and careful hand washing to prevent spread of the virus.  Increase the usage of your inhaler if you have asthma.   Do not use any tobacco products, including cigarettes, chewing tobacco, or electronic cigarettes. If you need help quitting,  ask your health care provider. PREVENTION  The best way to protect yourself from getting a cold is to practice good hygiene.   Avoid oral or hand contact with people with cold symptoms.   Wash your hands often if contact occurs.  There is no clear evidence that vitamin C, vitamin E, echinacea, or exercise reduces the chance of developing a cold. However, it is always recommended to get plenty of rest, exercise, and practice good nutrition.  SEEK MEDICAL CARE IF:   You are getting worse rather than better.   Your symptoms are not controlled by medicine.   You have chills.  You have worsening shortness of breath.  You have brown or red mucus.  You have yellow or brown nasal discharge.  You have pain in your face, especially when you bend forward.  You have a fever.  You have swollen neck glands.  You have pain while swallowing.  You have white areas in the back of your throat. SEEK IMMEDIATE MEDICAL CARE IF:   You have severe or persistent:  Headache.  Ear pain.  Sinus pain.  Chest pain.  You have chronic lung disease and any of the following:  Wheezing.  Prolonged cough.  Coughing up blood.  A change in your usual mucus.  You have a stiff neck.  You have changes in your:  Vision.  Hearing.  Thinking.  Mood. MAKE SURE YOU:   Understand these instructions.  Will watch your condition.  Will get help right away if you are not doing well or get worse.   This information is not intended to replace advice given to you by your health care provider. Make sure you discuss any questions you have with your health care provider.   Document Released: 03/09/2001 Document Revised: 01/28/2015 Document Reviewed: 12/19/2013 Elsevier Interactive Patient Education [employer]

## 2015-09-21 NOTE — ED Notes (Signed)
Per pt, states cough and back pain for a week

## 2015-10-04 ENCOUNTER — Other Ambulatory Visit: Payer: Self-pay | Admitting: Cardiology

## 2015-10-06 ENCOUNTER — Ambulatory Visit: Payer: Managed Care, Other (non HMO) | Admitting: Cardiology

## 2015-10-27 ENCOUNTER — Other Ambulatory Visit: Payer: Self-pay | Admitting: Cardiology

## 2015-11-03 ENCOUNTER — Other Ambulatory Visit: Payer: Self-pay | Admitting: *Deleted

## 2015-11-03 MED ORDER — FLECAINIDE ACETATE 100 MG PO TABS: ORAL_TABLET | ORAL | Status: DC

## 2015-11-18 ENCOUNTER — Ambulatory Visit
Admission: RE | Admit: 2015-11-18 | Discharge: 2015-11-18 | Disposition: A | Discharge status: Home / Self Care | Payer: BLUE CROSS/BLUE SHIELD | Source: Ambulatory Visit

## 2015-11-18 DIAGNOSIS — Z1231 Encounter for screening mammogram for malignant neoplasm of breast: Secondary | ICD-10-CM

## 2015-11-19 ENCOUNTER — Other Ambulatory Visit: Payer: Self-pay | Admitting: Obstetrics & Gynecology

## 2015-11-19 DIAGNOSIS — N63 Unspecified lump in unspecified breast: Secondary | ICD-10-CM

## 2015-11-26 ENCOUNTER — Ambulatory Visit
Admission: RE | Admit: 2015-11-26 | Discharge: 2015-11-26 | Disposition: A | Discharge status: Home / Self Care | Payer: BLUE CROSS/BLUE SHIELD | Source: Ambulatory Visit | Attending: Obstetrics & Gynecology | Admitting: Obstetrics & Gynecology

## 2015-11-26 DIAGNOSIS — N63 Unspecified lump in unspecified breast: Secondary | ICD-10-CM

## 2015-12-21 NOTE — Progress Notes (Signed)
[address] [place], [place]  [address] Phone: [phone] Fax:  [phone]  Date:  12/22/2015   ID:  Parker, Sawatzky 1951/03/23, MRN [id number]  PCP:  Frederich Chick, MD  Cardiologist:  Armanda Magic, MD    History of Present Illness: Sydney Ferguson is a 65 y.o. female with a history of PAF who presents today for followup.She denies any exertional anginal chest pain but she has had a few episodes where she gets a pressure in her chest that wakes her up at night.  It only occurs at night.  She has a history of GERD but does not feel like her typical GERD. She denies any dizziness or syncope. She has occasionaly LE edema which is stable.  She occasionally has a few palpitations that are very short lived for a few seconds.  She has chronic SOB secondary to weight gain and stress test and echo earlier this year were normal.  Her SOB is stable.      Wt Readings from Last 3 Encounters:  12/22/15 223 lb (101.152 kg)  08/29/14 224 lb (101.606 kg)  09/18/13 227 lb (102.967 kg)     Past Medical History  Diagnosis Date  . Migraines   . Endometriosis   . CFIDS (chronic fatigue and immune dysfunction syndrome)   . Fibromyalgia   . Atrial tachycardia (HCC)   . PAF (paroxysmal atrial fibrillation) (HCC)     s/p failed ablation 2002    Current Outpatient Prescriptions  Medication Sig Dispense Refill  . ALPRAZolam (XANAX) 0.5 MG tablet Take 0.5 mg by mouth daily as needed for anxiety.   1  . BIOTIN 5000 PO Take 1 capsule by mouth daily.    . cetirizine (ZYRTEC) 10 MG tablet Take 10 mg by mouth daily.    . ciclesonide (OMNARIS) 50 MCG/ACT nasal spray Place 2 sprays into both nostrils as needed for allergies.    . clonazePAM (KLONOPIN) 0.5 MG tablet Take 0.5 mg by mouth at bedtime.    . cyclobenzaprine (FLEXERIL) 10 MG tablet TAKE 1 TABLET BY MOUTH AT BEDTIME  0  . DULoxetine (CYMBALTA) 60 MG capsule TAKE 1 CAPSULE ONCE A DAY  3  . esomeprazole  (NEXIUM) 20 MG capsule Take 20 mg by mouth 2 (two) times daily before a meal.     . flecainide (TAMBOCOR) 100 MG tablet TAKE 1 TABLET (100 MG TOTAL) BY MOUTH 2 (TWO) TIMES DAILY. 180 tablet 1  . fluticasone (FLONASE) 50 MCG/ACT nasal spray Place 2 sprays into both nostrils daily.  0  . gabapentin (NEURONTIN) 100 MG capsule Take 100 mg by mouth 3 (three) times daily.  11  . metoprolol succinate (TOPROL-XL) 25 MG 24 hr tablet TAKE 0.5 TABLETS (12.5 MG TOTAL) BY MOUTH DAILY. 45 tablet 1  . MILK THISTLE PO Take 2 tablets by mouth daily.    . Vitamin D, Ergocalciferol, (DRISDOL) 50000 UNITS CAPS capsule TAKE 1 CAPSULE BY MOUTH WEEKLY  1  . zolmitriptan (ZOMIG) 5 MG tablet Take 5 mg by mouth daily as needed for migraine.      No current facility-administered medications for this visit.    Allergies:    Allergies  Allergen Reactions  . Codeine Nausea Only    Social History:  The patient  reports that she quit smoking about 23 years ago. Her smoking use included Cigarettes. She smoked 0.25 packs per day. She has never  used smokeless tobacco. She reports that she drinks alcohol. She reports that she does not use illicit drugs.   Family History:  The patient's family history includes Arrhythmia in her father; Dementia in her mother; Diabetes in her brother.   ROS:  Please see the history of present illness.      All other systems reviewed and negative.   PHYSICAL EXAM: VS:  BP 150/88 mmHg  Pulse 98  Ht 5\' 9"  (1.753 m)  Wt 223 lb (101.152 kg)  BMI 32.92 kg/m2 Well nourished, well developed, in no acute distress HEENT: normal Neck: no JVD Cardiac:  normal S1, S2; RRR; no murmur Lungs:  clear to auscultation bilaterally, no wheezing, rhonchi or rales Abd: soft, nontender, no hepatomegaly Ext: no edema Skin: warm and dry Neuro:  CNs 2-12 intact, no focal abnormalities noted  EKG:     NSR with no ST changes, IRBBB  ASSESSMENT AND PLAN:    1.  PAF well controlled on current medical  regimen.  CHADS2VASC Score is 1 (female) therefore no longterm anticoagulation until next year when she turns 66 and CHADS2VASC becomes 2.   - continue metoprolol/Flecainide   2.  SOB due to weight gain with no change since I saw her last - normal stress test and echo in 2014   3.  Chest pain that is atypical and only occurs at night.  I suspect that this is related to GERD and esophageal spasm but she is very concerned that it might be her heart.  I will set her up for an ETT to rule out ischemia.    4.  Elevated BP - I have asked her to check her BP daily for a week and call with the results.    Followup with me in 1 year   Signed, Armanda Magicraci Raedyn Klinck, MD [facility] 12/22/2015 3:34 PM

## 2015-12-22 ENCOUNTER — Encounter: Payer: Self-pay | Admitting: Cardiology

## 2015-12-22 ENCOUNTER — Ambulatory Visit (INDEPENDENT_AMBULATORY_CARE_PROVIDER_SITE_OTHER): Payer: BLUE CROSS/BLUE SHIELD | Admitting: Cardiology

## 2015-12-22 VITALS — BP 150/88 | HR 98 | Ht 69.0 in | Wt 223.0 lb

## 2015-12-22 DIAGNOSIS — R079 Chest pain, unspecified: Secondary | ICD-10-CM

## 2015-12-22 DIAGNOSIS — R030 Elevated blood-pressure reading, without diagnosis of hypertension: Secondary | ICD-10-CM | POA: Diagnosis not present

## 2015-12-22 DIAGNOSIS — R0602 Shortness of breath: Secondary | ICD-10-CM | POA: Diagnosis not present

## 2015-12-22 DIAGNOSIS — I480 Paroxysmal atrial fibrillation: Secondary | ICD-10-CM | POA: Diagnosis not present

## 2015-12-22 DIAGNOSIS — IMO0001 Reserved for inherently not codable concepts without codable children: Secondary | ICD-10-CM

## 2015-12-22 NOTE — Patient Instructions (Signed)
Medication Instructions:  Your physician recommends that you continue on your current medications as directed. Please refer to the Current Medication list given to you today.   Labwork: None  Testing/Procedures: Your physician has requested that you have an exercise tolerance test. For further information please visit [email or web address]. Please also follow instruction sheet, as given.  Follow-Up: Your physician wants you to follow-up in: 1 year with Dr. Turner. You will receive a reminder letter in the mail two months in advance. If you don't receive a letter, please call our office to schedule the follow-up appointment.   Any Other Special Instructions Will Be Listed Below (If Applicable).     If you need a refill on your cardiac medications before your next appointment, please call your pharmacy.   

## 2015-12-30 ENCOUNTER — Ambulatory Visit (INDEPENDENT_AMBULATORY_CARE_PROVIDER_SITE_OTHER): Payer: BLUE CROSS/BLUE SHIELD

## 2015-12-30 DIAGNOSIS — R079 Chest pain, unspecified: Secondary | ICD-10-CM | POA: Diagnosis not present

## 2015-12-30 LAB — EXERCISE TOLERANCE TEST
CHL CUP STRESS STAGE 1 DBP: 92 mmHg
CHL CUP STRESS STAGE 1 SPEED: 0 mph
CHL CUP STRESS STAGE 2 GRADE: 0 %
CHL CUP STRESS STAGE 2 HR: 83 {beats}/min
CHL CUP STRESS STAGE 3 GRADE: 0.1 %
CHL CUP STRESS STAGE 3 HR: 83 {beats}/min
CHL CUP STRESS STAGE 4 HR: 109 {beats}/min
CHL CUP STRESS STAGE 4 SBP: 214 mmHg
CHL CUP STRESS STAGE 5 DBP: 86 mmHg
CHL CUP STRESS STAGE 5 GRADE: 12 %
CHL CUP STRESS STAGE 5 HR: 130 {beats}/min
CHL CUP STRESS STAGE 5 SBP: 220 mmHg
CHL CUP STRESS STAGE 5 SPEED: 2.5 mph
CHL CUP STRESS STAGE 6 GRADE: 14 %
CHL CUP STRESS STAGE 7 HR: 126 {beats}/min
CHL CUP STRESS STAGE 8 HR: 89 {beats}/min
CHL RATE OF PERCEIVED EXERTION: 17
CSEPPMHR: 85 %
Estimated workload: 7.5 METS
Exercise duration (min): 6 min
Exercise duration (sec): 21 s
MPHR: 156 {beats}/min
Peak HR: 133 {beats}/min
Percent HR: 85 %
Rest HR: 77 {beats}/min
Stage 1 Grade: 0 %
Stage 1 HR: 82 {beats}/min
Stage 1 SBP: 167 mmHg
Stage 2 Speed: 1 mph
Stage 3 Speed: 1 mph
Stage 4 DBP: 93 mmHg
Stage 4 Grade: 10 %
Stage 4 Speed: 1.7 mph
Stage 6 HR: 133 {beats}/min
Stage 6 Speed: 3.4 mph
Stage 7 DBP: 88 mmHg
Stage 7 Grade: 0 %
Stage 7 SBP: 213 mmHg
Stage 7 Speed: 0 mph
Stage 8 DBP: 85 mmHg
Stage 8 Grade: 0 %
Stage 8 SBP: 182 mmHg
Stage 8 Speed: 0 mph

## 2016-03-27 ENCOUNTER — Other Ambulatory Visit: Payer: Self-pay | Admitting: Cardiology

## 2016-04-18 ENCOUNTER — Other Ambulatory Visit: Payer: Self-pay | Admitting: Cardiology

## 2016-12-02 DIAGNOSIS — Z7984 Long term (current) use of oral hypoglycemic drugs: Secondary | ICD-10-CM | POA: Diagnosis not present

## 2016-12-02 DIAGNOSIS — E119 Type 2 diabetes mellitus without complications: Secondary | ICD-10-CM | POA: Diagnosis not present

## 2016-12-20 DIAGNOSIS — J111 Influenza due to unidentified influenza virus with other respiratory manifestations: Secondary | ICD-10-CM | POA: Diagnosis not present

## 2016-12-20 DIAGNOSIS — R6889 Other general symptoms and signs: Secondary | ICD-10-CM | POA: Diagnosis not present

## 2016-12-20 DIAGNOSIS — R509 Fever, unspecified: Secondary | ICD-10-CM | POA: Diagnosis not present

## 2016-12-20 DIAGNOSIS — R05 Cough: Secondary | ICD-10-CM | POA: Diagnosis not present

## 2017-01-07 ENCOUNTER — Other Ambulatory Visit: Payer: Self-pay | Admitting: *Deleted

## 2017-01-07 MED ORDER — METOPROLOL SUCCINATE ER 25 MG PO TB24: 12.5000 mg | ORAL_TABLET | Freq: Every day | ORAL | 0 refills | Status: DC

## 2017-01-25 DIAGNOSIS — Z7984 Long term (current) use of oral hypoglycemic drugs: Secondary | ICD-10-CM | POA: Diagnosis not present

## 2017-01-25 DIAGNOSIS — E119 Type 2 diabetes mellitus without complications: Secondary | ICD-10-CM | POA: Diagnosis not present

## 2017-01-28 ENCOUNTER — Other Ambulatory Visit: Payer: Self-pay | Admitting: Cardiology

## 2017-01-29 ENCOUNTER — Other Ambulatory Visit: Payer: Self-pay | Admitting: Cardiology

## 2017-01-31 NOTE — Telephone Encounter (Signed)
Medication Detail    Disp Refills Start End   flecainide (TAMBOCOR) 100 MG tablet 60 tablet 0 01/28/2017    Sig - Route: Take 1 tablet (100 mg total) by mouth 2 (two) times daily. *Patient needs to call and schedule a one year follow up appointment* - Oral   E-Prescribing Status: Receipt confirmed by pharmacy (01/28/2017 11:24 AM EDT)   Pharmacy   [facility] [phone] - [place], [place] - [address]

## 2017-02-08 ENCOUNTER — Other Ambulatory Visit: Payer: Self-pay | Admitting: Cardiology

## 2017-03-01 ENCOUNTER — Other Ambulatory Visit: Payer: Self-pay | Admitting: Cardiology

## 2017-03-12 ENCOUNTER — Other Ambulatory Visit: Payer: Self-pay | Admitting: Cardiology

## 2017-03-21 ENCOUNTER — Other Ambulatory Visit: Payer: Self-pay | Admitting: Cardiology

## 2017-04-02 ENCOUNTER — Other Ambulatory Visit: Payer: Self-pay | Admitting: Cardiology

## 2017-04-12 ENCOUNTER — Other Ambulatory Visit: Payer: Self-pay | Admitting: Cardiology

## 2017-04-20 ENCOUNTER — Other Ambulatory Visit: Payer: Self-pay | Admitting: Cardiology

## 2017-04-20 MED ORDER — METOPROLOL SUCCINATE ER 25 MG PO TB24: 12.5000 mg | ORAL_TABLET | Freq: Every day | ORAL | 1 refills | Status: DC

## 2017-04-20 MED ORDER — FLECAINIDE ACETATE 100 MG PO TABS: 100.0000 mg | ORAL_TABLET | Freq: Two times a day (BID) | ORAL | 1 refills | Status: DC

## 2017-04-20 NOTE — Telephone Encounter (Signed)
Pt's medication was sent to pt's pharmacy as requested. Confirmation received.  °

## 2017-04-21 DIAGNOSIS — R309 Painful micturition, unspecified: Secondary | ICD-10-CM | POA: Diagnosis not present

## 2017-04-21 DIAGNOSIS — N390 Urinary tract infection, site not specified: Secondary | ICD-10-CM | POA: Diagnosis not present

## 2017-04-28 ENCOUNTER — Other Ambulatory Visit: Payer: Self-pay | Admitting: Family Medicine

## 2017-04-28 DIAGNOSIS — R1030 Lower abdominal pain, unspecified: Secondary | ICD-10-CM

## 2017-05-02 ENCOUNTER — Ambulatory Visit
Admission: RE | Admit: 2017-05-02 | Discharge: 2017-05-02 | Disposition: A | Discharge status: Home / Self Care | Payer: PPO | Source: Ambulatory Visit | Attending: Family Medicine | Admitting: Family Medicine

## 2017-05-02 DIAGNOSIS — R1030 Lower abdominal pain, unspecified: Secondary | ICD-10-CM

## 2017-05-02 DIAGNOSIS — R102 Pelvic and perineal pain: Secondary | ICD-10-CM | POA: Diagnosis not present

## 2017-05-09 ENCOUNTER — Ambulatory Visit: Payer: BLUE CROSS/BLUE SHIELD | Admitting: Physician Assistant

## 2017-05-16 ENCOUNTER — Other Ambulatory Visit: Payer: Self-pay | Admitting: Family Medicine

## 2017-05-16 DIAGNOSIS — Z1231 Encounter for screening mammogram for malignant neoplasm of breast: Secondary | ICD-10-CM

## 2017-05-17 DIAGNOSIS — N83201 Unspecified ovarian cyst, right side: Secondary | ICD-10-CM | POA: Diagnosis not present

## 2017-05-17 DIAGNOSIS — Z8742 Personal history of other diseases of the female genital tract: Secondary | ICD-10-CM | POA: Diagnosis not present

## 2017-05-24 ENCOUNTER — Ambulatory Visit (INDEPENDENT_AMBULATORY_CARE_PROVIDER_SITE_OTHER): Payer: PPO | Admitting: Physician Assistant

## 2017-05-24 ENCOUNTER — Encounter: Payer: Self-pay | Admitting: Physician Assistant

## 2017-05-24 VITALS — BP 124/82 | HR 91 | Ht 69.0 in | Wt 212.4 lb

## 2017-05-24 DIAGNOSIS — E119 Type 2 diabetes mellitus without complications: Secondary | ICD-10-CM

## 2017-05-24 DIAGNOSIS — I480 Paroxysmal atrial fibrillation: Secondary | ICD-10-CM | POA: Diagnosis not present

## 2017-05-24 MED ORDER — RIVAROXABAN 20 MG PO TABS: 20.0000 mg | ORAL_TABLET | Freq: Every day | ORAL | 11 refills | Status: DC

## 2017-05-24 NOTE — Progress Notes (Signed)
Cardiology Office Note    Date:  05/24/2017   ID:  Sydney Ferguson [birth year], MRN [id number]  PCP:  Sydney Mylar, MD  Cardiologist: Dr. Mayford Knife  Chief Complaint  Patient presents with  . Follow-up    History of Present Illness:  Sydney Ferguson is a 66 y.o. female with history of PAF status post failed ablation 2002 on flecainide, Chadsvasc=3 for female, age and DM. Recommend anticoagulation when she turns 66. History of GERD, chronic dyspnea on exertion secondary to weight gain with normal stress test and echo in 2014. Last saw Dr. Mayford Knife 11/2015 and blood pressure was high that day. She was to call back with blood pressure readings from home.  Patient comes in today for follow-up. She denies any chest pain, palpitations, dyspnea, dyspnea on exertion, dizziness or presyncope. She exercises regularly and hasn't had any breakthrough atrial fib.    Past Medical History:  Diagnosis Date  . Atrial tachycardia (HCC)   . CFIDS (chronic fatigue and immune dysfunction syndrome) (HCC)   . Endometriosis   . Fibromyalgia   . Migraines   . PAF (paroxysmal atrial fibrillation) (HCC)    s/p failed ablation 2002    Past Surgical History:  Procedure Laterality Date  . ABLATION  2002   unsucessful    Current Medications: Current Meds  Medication Sig  . ALPRAZolam (XANAX) 0.5 MG tablet Take 0.5 mg by mouth daily as needed for anxiety.   Marland Kitchen BIOTIN 5000 PO Take 1 capsule by mouth daily.  . cetirizine (ZYRTEC) 10 MG tablet Take 10 mg by mouth daily.  . ciclesonide (OMNARIS) 50 MCG/ACT nasal spray Place 2 sprays into both nostrils daily as needed for allergies.   . clonazePAM (KLONOPIN) 1 MG tablet Take 1 mg by mouth daily.  . cyclobenzaprine (FLEXERIL) 10 MG tablet TAKE 1 TABLET BY MOUTH AT BEDTIME  . DULoxetine (CYMBALTA) 60 MG capsule TAKE 1 CAPSULE ONCE A DAY  . flecainide (TAMBOCOR) 100 MG tablet Take 1 tablet (100 mg total) by mouth 2 (two) times daily.  Marland Kitchen gabapentin  (NEURONTIN) 100 MG capsule Take 100 mg by mouth 3 (three) times daily. Take 300mg  by mouth at night  . glipiZIDE (GLUCOTROL XL) 5 MG 24 hr tablet Take 5 mg by mouth daily.  . metoprolol succinate (TOPROL-XL) 25 MG 24 hr tablet Take 0.5 tablets (12.5 mg total) by mouth daily.  Marland Kitchen omeprazole (PRILOSEC) 40 MG capsule Take 40 mg by mouth daily.  Marland Kitchen zolmitriptan (ZOMIG) 5 MG tablet Take 5 mg by mouth daily as needed for migraine.      Allergies:   Codeine   Social History   Social History  . Marital status: Married    Spouse name: N/A  . Number of children: N/A  . Years of education: N/A   Social History Main Topics  . Smoking status: Former Smoker    Packs/day: 0.25    Types: Cigarettes    Quit date: 09/27/1992  . Smokeless tobacco: Never Used  . Alcohol use Yes     Comment: rare  . Drug use: No  . Sexual activity: Not Asked   Other Topics Concern  . None   Social History Narrative  . None     Family History:  The patient's   family history includes Arrhythmia in her father; Dementia in her mother; Diabetes in her brother.   ROS:   Please see the history of present illness.    Review of Systems  Constitution: Negative.  HENT: Negative.   Eyes: Negative.   Cardiovascular: Negative.   Respiratory: Negative.   Hematologic/Lymphatic: Negative.   Musculoskeletal: Negative.  Negative for joint pain.  Gastrointestinal: Negative.   Genitourinary: Negative.   Neurological: Negative.    All other systems reviewed and are negative.   PHYSICAL EXAM:   VS:  BP 124/82   Pulse 91   Ht 5\' 9"  (1.753 m)   Wt 212 lb 6.4 oz (96.3 kg)   BMI 31.37 kg/m   Physical Exam  GEN: Well nourished, well developed, in no acute distress  Neck: no JVD, carotid bruits, or masses Cardiac:RRR; 2/6 systolic murmur at the left sternal border  Respiratory:  clear to auscultation bilaterally, normal work of breathing GI: soft, nontender, nondistended, + BS Ext: without cyanosis, clubbing, or edema,  Good distal pulses bilaterally Neuro:  Alert and Oriented x 3 Psych: euthymic mood, full affect  Wt Readings from Last 3 Encounters:  05/24/17 212 lb 6.4 oz (96.3 kg)  12/22/15 223 lb (101.2 kg)  08/29/14 224 lb (101.6 kg)      Studies/Labs Reviewed:   EKG:  EKG is  ordered today.  The ekg ordered today demonstrates Normal sinus rhythm with incomplete right bundle branch block  Recent Labs: No results found for requested labs within last 8760 hours.   Lipid Panel No results found for: CHOL, TRIG, HDL, CHOLHDL, VLDL, LDLCALC, LDLDIRECT  Additional studies/ records that were reviewed today include:  2-D echo 12/2014Study Conclusions  - Left ventricle: The cavity size was normal. There was mild   focal basal hypertrophy of the septum. Systolic function   was vigorous. The estimated ejection fraction was in the   range of 65% to 70%. Wall motion was normal; there were no   regional wall motion abnormalities. Doppler parameters are   consistent with abnormal left ventricular relaxation   (grade 1 diastolic dysfunction). - Left atrium: The atrium was normal in size.   Anterior-posterior dimension: 32mm (2D).     ASSESSMENT:    1. PAF (paroxysmal atrial fibrillation) (HCC)   2. Controlled type 2 diabetes mellitus without complication, without long-term current use of insulin (HCC)      PLAN:  In order of problems listed above:  PAF maintaining normal sinus rhythm on flecainide. CHADSVASC=3 now that she is 71 and diabetic and female. Discussed with Dr. Mayford Knife who recommends Xarelto 20 mg daily. Patient is reluctant to start a blood thinner but says she will. Check renal function  Diabetes mellitus type 2 on glipizide mange by primary care    Medication Adjustments/Labs and Tests Ordered: Current medicines are reviewed at length with the patient today.  Concerns regarding medicines are outlined above.  Medication changes, Labs and Tests ordered today are listed in the  Patient Instructions below. Patient Instructions  Medication Instructions:  Your physician has recommended you make the following change in your medication:  1.Start Xarelto (20mg ) daily. This has been sent in to your pharmacy. You have been given samples.   Labwork: Your physician recommends that you have  lab work today: BMET    Testing/Procedures: None  Follow-Up: Your physician wants you to follow-up in: 6 months with Dr.Turner.  You will receive a reminder letter in the mail two months in advance. If you don't receive a letter, please call our office to schedule the follow-up appointment.   Any Other Special Instructions Will Be Listed Below (If Applicable).     If you need a refill on your cardiac medications  before your next appointment, please call your pharmacy.      Elson Clan, PA-C  05/24/2017 2:47 PM    [facility] [address] [place], [place], [place]  [address] Phone: [phone]; Fax: [phone]

## 2017-05-24 NOTE — Patient Instructions (Signed)
Medication Instructions:  Your physician has recommended you make the following change in your medication:  1.Start Xarelto (20mg ) daily. This has been sent in to your pharmacy. You have been given samples.   Labwork: Your physician recommends that you have  lab work today: BMET    Testing/Procedures: None  Follow-Up: Your physician wants you to follow-up in: 6 months with Dr.Turner.  You will receive a reminder letter in the mail two months in advance. If you don't receive a letter, please call our office to schedule the follow-up appointment.   Any Other Special Instructions Will Be Listed Below (If Applicable).     If you need a refill on your cardiac medications before your next appointment, please call your pharmacy.

## 2017-05-25 LAB — BASIC METABOLIC PANEL
BUN / CREAT RATIO: 20 (ref 12–28)
BUN: 13 mg/dL (ref 8–27)
CHLORIDE: 96 mmol/L (ref 96–106)
CO2: 26 mmol/L (ref 20–29)
Calcium: 10 mg/dL (ref 8.7–10.3)
Creatinine, Ser: 0.65 mg/dL (ref 0.57–1.00)
GFR calc Af Amer: 108 mL/min/{1.73_m2} (ref >59)
GFR calc non Af Amer: 93 mL/min/{1.73_m2} (ref >59)
GLUCOSE: 164 mg/dL — AB (ref 65–99)
POTASSIUM: 4.5 mmol/L (ref 3.5–5.2)
SODIUM: 139 mmol/L (ref 134–144)

## 2017-05-27 ENCOUNTER — Ambulatory Visit
Admission: RE | Admit: 2017-05-27 | Discharge: 2017-05-27 | Disposition: A | Discharge status: Home / Self Care | Payer: PPO | Source: Ambulatory Visit | Attending: Family Medicine | Admitting: Family Medicine

## 2017-05-27 DIAGNOSIS — Z1231 Encounter for screening mammogram for malignant neoplasm of breast: Secondary | ICD-10-CM

## 2017-05-27 DIAGNOSIS — E119 Type 2 diabetes mellitus without complications: Secondary | ICD-10-CM | POA: Diagnosis not present

## 2017-05-27 DIAGNOSIS — I480 Paroxysmal atrial fibrillation: Secondary | ICD-10-CM | POA: Diagnosis not present

## 2017-05-27 DIAGNOSIS — G43709 Chronic migraine without aura, not intractable, without status migrainosus: Secondary | ICD-10-CM | POA: Diagnosis not present

## 2017-05-27 DIAGNOSIS — I471 Supraventricular tachycardia: Secondary | ICD-10-CM | POA: Diagnosis not present

## 2017-05-27 DIAGNOSIS — Z0000 Encounter for general adult medical examination without abnormal findings: Secondary | ICD-10-CM | POA: Diagnosis not present

## 2017-05-27 DIAGNOSIS — Z23 Encounter for immunization: Secondary | ICD-10-CM | POA: Diagnosis not present

## 2017-05-27 DIAGNOSIS — E782 Mixed hyperlipidemia: Secondary | ICD-10-CM | POA: Diagnosis not present

## 2017-06-01 ENCOUNTER — Encounter: Payer: Self-pay | Admitting: Neurology

## 2017-06-03 DIAGNOSIS — Z23 Encounter for immunization: Secondary | ICD-10-CM | POA: Diagnosis not present

## 2017-06-12 ENCOUNTER — Other Ambulatory Visit: Payer: Self-pay | Admitting: Cardiology

## 2017-06-24 ENCOUNTER — Other Ambulatory Visit: Payer: Self-pay | Admitting: Family Medicine

## 2017-06-24 DIAGNOSIS — N838 Other noninflammatory disorders of ovary, fallopian tube and broad ligament: Secondary | ICD-10-CM

## 2017-07-12 DIAGNOSIS — N83201 Unspecified ovarian cyst, right side: Secondary | ICD-10-CM | POA: Diagnosis not present

## 2017-08-04 ENCOUNTER — Encounter: Payer: Self-pay | Admitting: Neurology

## 2017-08-04 ENCOUNTER — Other Ambulatory Visit (INDEPENDENT_AMBULATORY_CARE_PROVIDER_SITE_OTHER): Payer: PPO

## 2017-08-04 ENCOUNTER — Ambulatory Visit (INDEPENDENT_AMBULATORY_CARE_PROVIDER_SITE_OTHER): Payer: PPO | Admitting: Neurology

## 2017-08-04 VITALS — BP 140/82 | HR 84 | Ht 69.75 in | Wt 217.0 lb

## 2017-08-04 DIAGNOSIS — R413 Other amnesia: Secondary | ICD-10-CM | POA: Diagnosis not present

## 2017-08-04 DIAGNOSIS — E0842 Diabetes mellitus due to underlying condition with diabetic polyneuropathy: Secondary | ICD-10-CM | POA: Diagnosis not present

## 2017-08-04 LAB — VITAMIN B12: Vitamin B-12: 245 pg/mL (ref 211–911)

## 2017-08-04 LAB — TSH: TSH: 1.55 u[IU]/mL (ref 0.35–4.50)

## 2017-08-04 NOTE — Patient Instructions (Signed)
1. Bloodwork for Dr. Hyman HopesWebb will be requested for review, if not done, we will order a thyroid and B12 test 2. Continue with control of blood sugars 3. Follow-up in 6 months, call for any changes  RECOMMENDATIONS FOR ALL PATIENTS WITH MEMORY PROBLEMS: 1. Continue to exercise (Recommend 30 minutes of walking everyday, or 3 hours every week) 2. Increase social interactions - continue going to [place] and enjoy social gatherings with friends and family 3. Eat healthy, avoid fried foods and eat more fruits and vegetables 4. Maintain adequate blood pressure, blood sugar, and blood cholesterol level. Reducing the risk of stroke and cardiovascular disease also helps promoting better memory. 5. Avoid stressful situations. Live a simple life and avoid aggravations. Organize your time and prepare for the next day in anticipation. 6. Sleep well, avoid any interruptions of sleep and avoid any distractions in the bedroom that may interfere with adequate sleep quality 7. Avoid sugar, avoid sweets as there is a strong link between excessive sugar intake, diabetes, and cognitive impairment We discussed the Mediterranean diet, which has been shown to help patients reduce the risk of progressive memory disorders and reduces cardiovascular risk. This includes eating fish, eat fruits and green leafy vegetables, nuts like almonds and hazelnuts, walnuts, and also use olive oil. Avoid fast foods and fried foods as much as possible. Avoid sweets and sugar as sugar use has been linked to worsening of memory function.

## 2017-08-04 NOTE — Progress Notes (Signed)
NEUROLOGY CONSULTATION NOTE  Sydney Ferguson MRN: [id number] DOB: 1950-12-23  Referring provider: Dr. Shirlean Mylararol Webb Primary care provider: Dr. Shirlean Mylararol Webb  Reason for consult:  Memory concerns, neuropathy  Dear Dr Hyman HopesWebb:  Thank you for your kind referral of Sydney Ferguson for consultation of the above symptoms. Although her history is well known to you, please allow me to reiterate it for the purpose of our medical record. The patient was accompanied to the clinic by her husband who also provides collateral information. Records and images were personally reviewed where available.  HISTORY OF PRESENT ILLNESS: This is a 66 year old right-handed woman with a history of atrial fibrillation, migraines, fibromyalgia, chronic fatigue syndrome, presenting for evaluation of worsening memory and neuropathy. She is very concerned due to strong family history of Alzheimer's disease in her family. Her mother and 6 maternal aunts had dementia. She started noticing word-finding difficulties around a year ago, not being able to find the right words, saying "cow" when thinking of "dog." Around 2 months ago, she put water for coffee in a different spot, which is similar to what her mother had done in the past, concerning her. Around 2 weeks ago, she got lost driving to her sister's house. She denies getting lost driving. She is pretty good with managing medications. Her husband is in charge of finances. She loses things a lot, forgetting what she was looking for. They endorse high stress over the past year taking care of her mother, with her parents passing away within a short span of each other. Her husband started noticing changes around a year ago, mostly with talking then hesitating looking for a word. He would end up finishing her sentences. No personality changes or hallucinations. No difficulties with ADLs. She denies any history of concussions. She has had a couple of drinks every night for the past 30  years.   She started having numbness, tingling, and sharp pain in both feet around 2-3 years ago. She saw Dr. Foy GuadalajaraFried who felt the symptoms were related to her fibromyalgia. She saw her new PCP Dr. Hyman HopesWebb and was found to have diabetes. Her last HbA1c was 7 (improved from initial diagnosis). She reports paresthesias are constant, gabapentin 300mg  qhs helps. She is also on Cymbalta for the fibromyalgia, she cannot walk if she misses a dose. Symptoms affect her foot up to the ankles. Hands are not affected. She denies any falls. She has rare migraines. She has some dizziness when standing. She gets choked a lot when swallowing. She has a lot of neck pain, some back pain. No diplopia, dysarthria, bowel/bladder dysfunction, anosmia, or tremors.   PAST MEDICAL HISTORY: Past Medical History:  Diagnosis Date  . Atrial tachycardia (HCC)   . CFIDS (chronic fatigue and immune dysfunction syndrome) (HCC)   . Endometriosis   . Fibromyalgia   . Migraines   . PAF (paroxysmal atrial fibrillation) (HCC)    s/p failed ablation 2002    PAST SURGICAL HISTORY: Past Surgical History:  Procedure Laterality Date  . ABLATION  2002   unsucessful  . BREAST BIOPSY Bilateral     MEDICATIONS: Current Outpatient Medications on File Prior to Visit  Medication Sig Dispense Refill  . ALPRAZolam (XANAX) 0.5 MG tablet Take 0.5 mg by mouth daily as needed for anxiety.   1  . BIOTIN 5000 PO Take 1 capsule by mouth daily.    . cetirizine (ZYRTEC) 10 MG tablet Take 10 mg by mouth daily.    .Marland Kitchen  ciclesonide (OMNARIS) 50 MCG/ACT nasal spray Place 2 sprays into both nostrils daily as needed for allergies.     . clonazePAM (KLONOPIN) 1 MG tablet Take 1 mg by mouth daily.    . cyclobenzaprine (FLEXERIL) 10 MG tablet TAKE 1 TABLET BY MOUTH AT BEDTIME  0  . DULoxetine (CYMBALTA) 60 MG capsule TAKE 1 CAPSULE ONCE A DAY  3  . flecainide (TAMBOCOR) 100 MG tablet TAKE 1 TABLET BY MOUTH TWICE A DAY 60 tablet 10  . gabapentin (NEURONTIN)  100 MG capsule Take 100 mg by mouth 3 (three) times daily. Take 300mg  by mouth at night  11  . glipiZIDE (GLUCOTROL XL) 5 MG 24 hr tablet Take 5 mg by mouth daily.  11  . metoprolol succinate (TOPROL-XL) 25 MG 24 hr tablet Take 0.5 tablets (12.5 mg total) by mouth daily. 15 tablet 10  . omeprazole (PRILOSEC) 40 MG capsule Take 40 mg by mouth daily.    . rivaroxaban (XARELTO) 20 MG TABS tablet Take 1 tablet (20 mg total) by mouth daily with supper. 30 tablet 11  . zolmitriptan (ZOMIG) 5 MG tablet Take 5 mg by mouth daily as needed for migraine.      No current facility-administered medications on file prior to visit.     ALLERGIES: Allergies  Allergen Reactions  . Codeine Nausea Only    FAMILY HISTORY: Family History  Problem Relation Age of Onset  . Dementia Mother   . Breast cancer Mother   . Arrhythmia Father   . Diabetes Brother     SOCIAL HISTORY: Social History   Socioeconomic History  . Marital status: Married    Spouse name: Not on file  . Number of children: Not on file  . Years of education: Not on file  . Highest education level: Not on file  Social Needs  . Financial resource strain: Not on file  . Food insecurity - worry: Not on file  . Food insecurity - inability: Not on file  . Transportation needs - medical: Not on file  . Transportation needs - non-medical: Not on file  Occupational History  . Not on file  Tobacco Use  . Smoking status: Former Smoker    Packs/day: 0.25    Types: Cigarettes    Last attempt to quit: 09/27/1992    Years since quitting: 24.8  . Smokeless tobacco: Never Used  Substance and Sexual Activity  . Alcohol use: Yes    Comment: rare  . Drug use: No  . Sexual activity: Not on file  Other Topics Concern  . Not on file  Social History Narrative  . Not on file    REVIEW OF SYSTEMS: Constitutional: No fevers, chills, or sweats, no generalized fatigue, change in appetite Eyes: No visual changes, double vision, eye pain Ear,  nose and throat: No hearing loss, ear pain, nasal congestion, sore throat Cardiovascular: No chest pain, palpitations Respiratory:  No shortness of breath at rest or with exertion, wheezes GastrointestinaI: No nausea, vomiting, diarrhea, abdominal pain, fecal incontinence Genitourinary:  No dysuria, urinary retention or frequency Musculoskeletal:  No neck pain, back pain Integumentary: No rash, pruritus, skin lesions Neurological: as above Psychiatric: No depression, insomnia, anxiety Endocrine: No palpitations, fatigue, diaphoresis, mood swings, change in appetite, change in weight, increased thirst Hematologic/Lymphatic:  No anemia, purpura, petechiae. Allergic/Immunologic: no itchy/runny eyes, nasal congestion, recent allergic reactions, rashes  PHYSICAL EXAM: There were no vitals filed for this visit. General: No acute distress Head:  Normocephalic/atraumatic Eyes: Fundoscopic exam  shows bilateral sharp discs, no vessel changes, exudates, or hemorrhages Neck: supple, no paraspinal tenderness, full range of motion Back: No paraspinal tenderness Heart: regular rate and rhythm Lungs: Clear to auscultation bilaterally. Vascular: No carotid bruits. Skin/Extremities: No rash, no edema Neurological Exam: Mental status: alert and oriented to person, place, and time, no dysarthria or aphasia, Fund of knowledge is appropriate.  Recent and remote memory are intact.  Attention and concentration are normal.    Able to name objects and repeat phrases.  MMSE - Mini Mental State Exam 08/04/2017  Orientation to time 5  Orientation to Place 5  Registration 3  Attention/ Calculation 5  Recall 2  Language- name 2 objects 2  Language- repeat 1  Language- follow 3 step command 3  Language- read & follow direction 1  Write a sentence 1  Copy design 1  Total score 29   Cranial nerves: CN I: not tested CN II: pupils equal, round and reactive to light, visual fields intact, fundi unremarkable. CN  III, IV, VI:  full range of motion, no nystagmus, no ptosis CN V: facial sensation intact CN VII: upper and lower face symmetric CN VIII: hearing intact to finger rub CN IX, X: gag intact, uvula midline CN XI: sternocleidomastoid and trapezius muscles intact CN XII: tongue midline Bulk & Tone: normal, no fasciculations, no cogwheeling Motor: 5/5 throughout with no pronator drift. Sensation: intact to light touch, cold, pin on both UE, decreased cold on left calf and foot, decreased cold on right foot, decreased pin to ankles bilaterally, decreased vibration sense to knees bilaterally.  No extinction to double simultaneous stimulation.  Romberg test negative Deep Tendon Reflexes: brisk +2 on both UE, unable to elicit reflexes on both LE, no ankle clonus Plantar responses: downgoing bilaterally Cerebellar: no incoordination on finger to nose, heel to shin. No dysdiadochokinesia Gait: narrow-based and steady, able to tandem walk adequately. Tremor: mild bilateral postural tremor, no endpoint or resting tremor  IMPRESSION: This is a 67 year old right-handed woman with a history of  atrial fibrillation, migraines, fibromyalgia, chronic fatigue syndrome, strong family history of dementia, presenting for evaluation of worsening memory and neuropathy. Her neurological exam shows evidence of a length-dependent neuropathy, like due to diabetic neuropathy. We discussed continued working on control of blood sugar. She may try uptitrating gabapentin for symptomatic treatment. If not done, check TSH and B12. We discussed normal MMSE of 29/30, no clear indication of dementia at this point, we discussed how stress can affect memory, she endorses significant stress the past year, since symptoms started. We discussed seeing a therapist. She will follow-up in 6 months and knows to call for any changes.   Thank you for allowing me to participate in the care of this patient. Please do not hesitate to call for any  questions or concerns.   Patrcia Dolly, M.D.  CC: Dr. Hyman Hopes

## 2017-08-05 ENCOUNTER — Telehealth: Payer: Self-pay

## 2017-08-05 NOTE — Telephone Encounter (Signed)
LMOM relaying message below.  

## 2017-08-05 NOTE — Telephone Encounter (Signed)
-----   Message from Vivien RotaAshley H Driver, LPN sent at 16/1/096011/05/2017  7:34 AM EST -----   ----- Message ----- From: Van ClinesAquino, Karen M, MD Sent: 08/05/2017   6:07 AM To: Vonzell SchlatterAshley H Driver, LPN  Pls let her know thyroid looks good. B12 is low normal, it is 245, usually we want it above 400 when patients have neurological symptoms such as neuropathy. Recommend start B12 500mcg tablet daily. Thanks for your help, Morrie Sheldonshley

## 2017-10-24 DIAGNOSIS — M25561 Pain in right knee: Secondary | ICD-10-CM | POA: Diagnosis not present

## 2017-11-01 ENCOUNTER — Ambulatory Visit
Admission: RE | Admit: 2017-11-01 | Discharge: 2017-11-01 | Disposition: A | Discharge status: Home / Self Care | Payer: PPO | Source: Ambulatory Visit | Attending: Family Medicine | Admitting: Family Medicine

## 2017-11-01 ENCOUNTER — Other Ambulatory Visit: Payer: Self-pay | Admitting: Family Medicine

## 2017-11-01 DIAGNOSIS — M25561 Pain in right knee: Secondary | ICD-10-CM

## 2017-11-03 ENCOUNTER — Other Ambulatory Visit: Payer: Self-pay

## 2017-11-03 ENCOUNTER — Ambulatory Visit: Payer: PPO | Attending: Family Medicine

## 2017-11-03 DIAGNOSIS — G8929 Other chronic pain: Secondary | ICD-10-CM | POA: Insufficient documentation

## 2017-11-03 DIAGNOSIS — M25561 Pain in right knee: Secondary | ICD-10-CM | POA: Diagnosis not present

## 2017-11-03 DIAGNOSIS — M6281 Muscle weakness (generalized): Secondary | ICD-10-CM | POA: Diagnosis not present

## 2017-11-03 DIAGNOSIS — R2689 Other abnormalities of gait and mobility: Secondary | ICD-10-CM | POA: Diagnosis not present

## 2017-11-03 NOTE — Patient Instructions (Addendum)
Knee Extension: Short Arc (Eccentric) - Supine or Sitting   Lie on back with roll under knee. Extend knee. Slowly lower foot for 3-5 seconds. _10__ reps per set, _5__ sets per day, _7__ days per week.   Copyright  VHI. All rights reserved.  Quad Set   With other leg bent, foot flat, slowly tighten muscles on thigh of straight leg while counting out loud to _5___. Repeat _10-20___ times.  Do 3x/day   Heel Slides   Use a strap or towel to Slide left heel along bed towards bottom. Hold for _5__ seconds. Perform gently. Slide back to flat knee position. Repeat 10___ times. Do _3__ times a day.      HIP: Hamstrings - Short Sitting    Rest leg on raised surface. Keep knee straight. Lift chest. Hold _20__ seconds. _3__ reps per set,   Copyright  VHI. All rights reserved.   Achilles Tendon Stretch    Stand with hands supported on wall, elbows slightly bent, feet parallel and both heels on floor, front knee bent, back knee straight. Slowly relax back knee until a stretch is felt in achilles tendon. Hold _20___ seconds. Repeat with leg positions switched.  Do 3 reps, 3 times a day  Copyright  VHI. All rights reserved.  [facility] [address], [address] [place], [place] [address] Phone # [phone] Fax [phone]

## 2017-11-03 NOTE — Therapy (Signed)
[facility] [address] [place], [place], [address] Phone: [phone]   Fax:  [phone]  Physical Therapy Evaluation  Patient Details  Name: Sydney Ferguson MRN: [id number] Date of Birth: 09-15-1951 Referring Provider: Shirlean Mylar, MD   Encounter Date: 11/03/2017  PT End of Session - 11/03/17 1100    Visit Number  1    Date for PT Re-Evaluation  12/29/17    Authorization Type  Medicare    PT Start Time  1016    PT Stop Time  1100    PT Time Calculation (min)  44 min    Activity Tolerance  Patient tolerated treatment well    Behavior During Therapy  [facility] for tasks assessed/performed       Past Medical History:  Diagnosis Date  . Atrial tachycardia (HCC)   . CFIDS (chronic fatigue and immune dysfunction syndrome) (HCC)   . Diabetes mellitus without complication (HCC)   . Endometriosis   . Fibromyalgia   . Hypertension   . Migraines   . PAF (paroxysmal atrial fibrillation) (HCC)    s/p failed ablation 2002    Past Surgical History:  Procedure Laterality Date  . ABLATION  2002   unsucessful  . BREAST BIOPSY Bilateral   . BREAST LUMPECTOMY    . PARTIAL HYSTERECTOMY      There were no vitals filed for this visit.   Subjective Assessment - 11/03/17 1021    Subjective  Pt presents to PT with complaints of Rt knee pain that began 6 weeks ago without cause.  Pt reports that she has been exercising with Silver Sneakers and feels this might have contributed.      Limitations  Walking;Sitting    How long can you sit comfortably?  pain with sitting to drive- limited to short trips    How long can you walk comfortably?  Rt knee pain: 15-20 minutes    Diagnostic tests  x-ray: had 2 days ago. No result yet.    Patient Stated Goals  reduce Rt knee pain, walk longer, return to Silver Sneakers    Currently in Pain?  Yes    Pain Score  6     Pain Location  Knee    Pain Orientation  Right    Pain Descriptors /  Indicators  Aching;Sore    Pain Type  Chronic pain    Pain Onset  More than a month ago    Pain Frequency  Constant    Aggravating Factors   sitting to drive, walking, negotiating steps    Pain Relieving Factors  medication, rest with legs elevated         [facility] PT Assessment - 11/03/17 0001      Assessment   Medical Diagnosis  Rt anterior knee pain    Referring Provider  Shirlean Mylar, MD    Onset Date/Surgical Date  09/22/17    Next MD Visit  none    Prior Therapy  none      Precautions   Precautions  None      Restrictions   Weight Bearing Restrictions  No      Balance Screen   Has the patient fallen in the past 6 months  No    Has the patient had a decrease in activity level because of a fear of falling?   No    Is the patient reluctant to leave their home because of a fear of falling?   No  Home Environment   Living Environment  Private residence    Living Arrangements  Spouse/significant other    Type of Home  House    Home Layout  Able to live on main level with bedroom/bathroom      Prior Function   Level of Independence  Independent    Vocation  Retired    Leisure  exercise, travel      Cognition   Overall Cognitive Status  Within Functional Limits for tasks assessed      Observation/Other Assessments   Focus on Therapeutic Outcomes (FOTO)   62% limitation      Posture/Postural Control   Posture/Postural Control  Postural limitations    Postural Limitations  Forward head    Posture Comments  genu varus bilaterally      ROM / Strength   AROM / PROM / Strength  AROM;PROM;Strength      AROM   Overall AROM   Within functional limits for tasks performed    Overall AROM Comments  Rt and Lt hip and knee A/ROM WFLs.  Pt reports posterior patella pain with end range Rt knee extension and flexion      PROM   Overall PROM   Within functional limits for tasks performed      Strength   Overall Strength  Deficits    Overall Strength Comments  quad set on  Rt is fair    Strength Assessment Site  Hip;Knee    Right/Left Hip  Right;Left    Right Hip Flexion  4+/5    Left Hip Flexion  4+/5    Right/Left Knee  Right;Left    Right Knee Flexion  4+/5    Right Knee Extension  4/5    Left Knee Flexion  5/5    Left Knee Extension  5/5      Palpation   Patella mobility  Rt patellar mobility limited by 25% vs Lt.  No pain.    Palpation comment  palpable tenderness over Rt medial hamstring insertion distally.  No other palpable tenderness       Transfers   Transfers  Independent with all Transfers      Ambulation/Gait   Ambulation/Gait  Yes    Gait Pattern  Within Functional Limits    Stairs  Yes    Stairs Assistance  7: Independent    Stair Management Technique  One rail Right;Alternating pattern Rt knee pain and instability with descending steps    Gait Comments  mild antalgia after sitting that normalized after 10 feet of walking             Objective measurements completed on examination: See above findings.              PT Education - 11/03/17 1055    Education provided  Yes    Education Details  gastroc and hamstring stretch, short arc quads, heel slides, quad sets    Person(s) Educated  Patient    Methods  Explanation;Demonstration;Handout    Comprehension  Verbalized understanding;Returned demonstration       PT Short Term Goals - 11/03/17 1205      PT SHORT TERM GOAL #1   Title  be independent in initial HEP    Time  4    Period  Weeks    Status  New    Target Date  12/01/17      PT SHORT TERM GOAL #2   Title  walk for 30 minutes without limitation due to pain  Time  4    Period  Weeks    Status  New    Target Date  12/01/17      PT SHORT TERM GOAL #3   Title  report < or = to 4/10 Rt knee pain with sitting to drive    Time  4    Period  Weeks    Status  New    Target Date  12/01/17      PT SHORT TERM GOAL #4   Title  demonstrate good quad set on the Rt to improve stability    Time  4     Period  Weeks    Status  New    Target Date  12/01/17        PT Long Term Goals - 11/03/17 1207      PT LONG TERM GOAL #1   Title  be independent in advanced HEP    Time  8    Period  Weeks    Status  New    Target Date  12/29/17      PT LONG TERM GOAL #2   Title  reduce FOTO to < or = to 44% limitation    Time  8    Period  Weeks    Status  New    Target Date  12/29/17      PT LONG TERM GOAL #3   Title  perform SLR on the Rt x 10 without quad lag to improve stability    Time  8    Period  Weeks    Status  New    Target Date  12/29/17      PT LONG TERM GOAL #4   Title  sit to drive for long distances without limitation due to Rt knee pain    Time  8    Period  Weeks    Status  New    Target Date  12/29/17      PT LONG TERM GOAL #5   Title  walk for 45 minutes to 1 hour in the community without limitation due to Rt knee pain    Time  8    Period  Weeks    Status  New    Target Date  12/29/17             Plan - 11/03/17 1155    Clinical Impression Statement  Pt presents to PT with complaints of a 6 weeks history of Rt knee pain without incident or injury.  Pt has stopped doing her 2x/wk Silver Sneakers class due to pain.  Pt reports 5/10 Rt knee pain, is limited to sitting and driving, walking 16-10 minutes, and negotiating steps due to pain.  Pt demonstrates painful Rt knee A/ROM, fair quad set, inability to perform SLR and palpable tenderness at distal hamstring insertion.  Pt with instability of the Rt quad with descending steps.  Pt will benefit from skilled PT for Rt knee flexibility, strength, stability and manual therapy.    Clinical Presentation  Stable    Clinical Decision Making  Low    Rehab Potential  Good    PT Frequency  2x / week    PT Duration  8 weeks    PT Treatment/Interventions  ADLs/Self Care Home Management;Cryotherapy;Electrical Stimulation;Ultrasound;Moist Heat;Gait training;Stair training;Functional mobility training;Therapeutic  activities;Therapeutic exercise;Patient/family education;Neuromuscular re-education;Manual techniques;Passive range of motion;Taping;Vasopneumatic Device;Dry needling    PT Next Visit Plan  Rt quad strength, manual to distal hamstrings, ROM/flexibility  Consulted and Agree with Plan of Care  Patient       Patient will benefit from skilled therapeutic intervention in order to improve the following deficits and impairments:  Pain, Abnormal gait, Increased muscle spasms, Decreased activity tolerance, Decreased strength, Difficulty walking  Visit Diagnosis: Chronic pain of right knee - Plan: PT plan of care cert/re-cert  Muscle weakness (generalized) - Plan: PT plan of care cert/re-cert  Other abnormalities of gait and mobility - Plan: PT plan of care cert/re-cert     Problem List Patient Active Problem List   Diagnosis Date Noted  . Diabetes mellitus type 2, controlled, without complications (HCC) 05/24/2017  . SOB (shortness of breath) 08/29/2013  . PAF (paroxysmal atrial fibrillation) (HCC) 08/27/2013  . Paroxysmal supraventricular tachycardia (HCC) 08/27/2013  . Elevated BP 08/27/2013    Lorrene ReidKelly Abraham Margulies, PT 11/03/17 12:13 PM  [facility] [address], [address] [place], [place], [address] Phone: [phone]   Fax:  [phone]  Name: Sydney Ferguson MRN: [id number] Date of Birth: [birth year]

## 2017-11-08 ENCOUNTER — Encounter: Payer: Self-pay | Admitting: Physical Therapy

## 2017-11-08 ENCOUNTER — Ambulatory Visit: Payer: PPO | Admitting: Physical Therapy

## 2017-11-08 DIAGNOSIS — G8929 Other chronic pain: Secondary | ICD-10-CM

## 2017-11-08 DIAGNOSIS — M6281 Muscle weakness (generalized): Secondary | ICD-10-CM

## 2017-11-08 DIAGNOSIS — R2689 Other abnormalities of gait and mobility: Secondary | ICD-10-CM

## 2017-11-08 DIAGNOSIS — M25561 Pain in right knee: Secondary | ICD-10-CM | POA: Diagnosis not present

## 2017-11-08 NOTE — Therapy (Signed)
[facility] [address] [place], [place], [address] Phone: [phone]   Fax:  [phone]  Physical Therapy Treatment  Patient Details  Name: Sydney Ferguson MRN: [id number] Date of Birth: [birth year] Referring Provider: Shirlean Mylar, MD   Encounter Date: 11/08/2017  PT End of Session - 11/08/17 1317    Visit Number  2    Date for PT Re-Evaluation  12/29/17    Authorization Type  Medicare    PT Start Time  1232    PT Stop Time  1315    PT Time Calculation (min)  43 min    Activity Tolerance  Patient tolerated treatment well    Behavior During Therapy  [facility] for tasks assessed/performed       Past Medical History:  Diagnosis Date  . Atrial tachycardia (HCC)   . CFIDS (chronic fatigue and immune dysfunction syndrome) (HCC)   . Diabetes mellitus without complication (HCC)   . Endometriosis   . Fibromyalgia   . Hypertension   . Migraines   . PAF (paroxysmal atrial fibrillation) (HCC)    s/p failed ablation 2002    Past Surgical History:  Procedure Laterality Date  . ABLATION  2002   unsucessful  . BREAST BIOPSY Bilateral   . BREAST LUMPECTOMY    . PARTIAL HYSTERECTOMY      There were no vitals filed for this visit.  Subjective Assessment - 11/08/17 1232    Subjective  Pt reports things are going well, but she does have knee pain currently. She has been doing her HEP atleast 1x/day.     Limitations  Walking;Sitting    How long can you sit comfortably?  pain with sitting to drive- limited to short trips    How long can you walk comfortably?  Rt knee pain: 15-20 minutes    Diagnostic tests  x-ray: had 2 days ago. No result yet.    Patient Stated Goals  reduce Rt knee pain, walk longer, return to Silver Sneakers    Currently in Pain?  Yes    Pain Score  7     Pain Location  Knee    Pain Orientation  Right;Medial    Pain Descriptors / Indicators  Aching;Throbbing    Pain Type  Chronic pain    Pain  Radiating Towards  none     Pain Onset  More than a month ago    Pain Frequency  Constant    Aggravating Factors   worse with prolonged positioning of the knee    Pain Relieving Factors  medication, rest, ice helps some          [facility] PT Assessment - 11/08/17 0001      Special Tests   Other special tests  (+) [place] on the Rt                  [facility] Adult PT Treatment/Exercise - 11/08/17 0001      Exercises   Exercises  Knee/Hip      Knee/Hip Exercises: Standing   Forward Step Up  Right;1 set;15 reps;Hand Hold: 1;Step Height: 4"      Knee/Hip Exercises: Seated   Long Arc Quad  Right;1 set;15 reps    Long Arc Quad Limitations  2nd set BLE with combined adductor ball squeeze x15 reps     Ball Squeeze  15x3 sec hold       Knee/Hip Exercises: Supine   Straight Leg Raises  Right;1 set;5 reps  Other Supine Knee/Hip Exercises  single leg clams with green TB 2x10 reps each       Knee/Hip Exercises: Prone   Hamstring Curl  1 set;10 reps;Limitations    Hamstring Curl Limitations  RLE only, double red TB       Manual Therapy   Manual Therapy  Soft tissue mobilization;Joint mobilization    Joint Mobilization  Grade III Rt patella medial/lateral mobilization; medial tibial MWM    Soft tissue mobilization  IASTM Rt quadriceps and hamstring (medial)             PT Education - 11/08/17 1316    Education provided  Yes    Education Details  discussed possible source of knee pain and benefits of maintaining strength/flexibility of the LEs; encouraged pt to find alternatives for aerobic conditioning rather than alot of hopping/jumping/jogging     Person(s) Educated  Patient    Methods  Explanation    Comprehension  Verbalized understanding       PT Short Term Goals - 11/03/17 1205      PT SHORT TERM GOAL #1   Title  be independent in initial HEP    Time  4    Period  Weeks    Status  New    Target Date  12/01/17      PT SHORT TERM GOAL #2   Title  walk  for 30 minutes without limitation due to pain    Time  4    Period  Weeks    Status  New    Target Date  12/01/17      PT SHORT TERM GOAL #3   Title  report < or = to 4/10 Rt knee pain with sitting to drive    Time  4    Period  Weeks    Status  New    Target Date  12/01/17      PT SHORT TERM GOAL #4   Title  demonstrate good quad set on the Rt to improve stability    Time  4    Period  Weeks    Status  New    Target Date  12/01/17        PT Long Term Goals - 11/03/17 1207      PT LONG TERM GOAL #1   Title  be independent in advanced HEP    Time  8    Period  Weeks    Status  New    Target Date  12/29/17      PT LONG TERM GOAL #2   Title  reduce FOTO to < or = to 44% limitation    Time  8    Period  Weeks    Status  New    Target Date  12/29/17      PT LONG TERM GOAL #3   Title  perform SLR on the Rt x 10 without quad lag to improve stability    Time  8    Period  Weeks    Status  New    Target Date  12/29/17      PT LONG TERM GOAL #4   Title  sit to drive for long distances without limitation due to Rt knee pain    Time  8    Period  Weeks    Status  New    Target Date  12/29/17      PT LONG TERM GOAL #5   Title  walk for 45  minutes to 1 hour in the community without limitation due to Rt knee pain    Time  8    Period  Weeks    Status  New    Target Date  12/29/17            Plan - 11/08/17 1317    Clinical Impression Statement  Today was pt's first treatment following her eval 5 days ago. She continues to have Rt knee pain with end ranges of closed and open chain knee flexion/extension. Noted some improvements in pain with medial glide applied to the tibia during LAQs, however there was no overall change in pain rating end of session. Pt was able to complete gentle hip and knee strengthening, and therapist completed soft tissue mobilization to address quadriceps/hamstring spasm. No additions were made to pt's HEP, although she was encouraged to  increase ice application at home. Will continue with current POC.     Rehab Potential  Good    PT Frequency  2x / week    PT Duration  8 weeks    PT Treatment/Interventions  ADLs/Self Care Home Management;Cryotherapy;Electrical Stimulation;Ultrasound;Moist Heat;Gait training;Stair training;Functional mobility training;Therapeutic activities;Therapeutic exercise;Patient/family education;Neuromuscular re-education;Manual techniques;Passive range of motion;Taping;Vasopneumatic Device;Dry needling    PT Next Visit Plan  RLE strengthening; manual to distal hamstrings, ROM/flexibility    PT Home Exercise Plan  MD signed orders     Consulted and Agree with Plan of Care  Patient       Patient will benefit from skilled therapeutic intervention in order to improve the following deficits and impairments:  Pain, Abnormal gait, Increased muscle spasms, Decreased activity tolerance, Decreased strength, Difficulty walking  Visit Diagnosis: Chronic pain of right knee  Muscle weakness (generalized)  Other abnormalities of gait and mobility     Problem List Patient Active Problem List   Diagnosis Date Noted  . Diabetes mellitus type 2, controlled, without complications (HCC) 05/24/2017  . SOB (shortness of breath) 08/29/2013  . PAF (paroxysmal atrial fibrillation) (HCC) 08/27/2013  . Paroxysmal supraventricular tachycardia (HCC) 08/27/2013  . Elevated BP 08/27/2013    1:25 PM,11/08/17 Donita Brooks PT, DPT [facility]  [phone]  [facility] [address], [address] [place], [place], [address] Phone: [phone]   Fax:  [phone]  Name: Sydney Ferguson MRN: [id number] Date of Birth: Feb 27, 1951

## 2017-11-10 ENCOUNTER — Ambulatory Visit: Payer: PPO | Admitting: Physical Therapy

## 2017-11-10 ENCOUNTER — Encounter: Payer: Self-pay | Admitting: Physical Therapy

## 2017-11-10 DIAGNOSIS — R2689 Other abnormalities of gait and mobility: Secondary | ICD-10-CM

## 2017-11-10 DIAGNOSIS — G8929 Other chronic pain: Secondary | ICD-10-CM

## 2017-11-10 DIAGNOSIS — M25561 Pain in right knee: Principal | ICD-10-CM

## 2017-11-10 DIAGNOSIS — M6281 Muscle weakness (generalized): Secondary | ICD-10-CM

## 2017-11-10 NOTE — Therapy (Signed)
[facility] [address], [address] [place], [place], [address] Phone: [phone]   Fax:  [phone]  Physical Therapy Treatment  Patient Details  Name: Sydney Ferguson MRN: [id number] Date of Birth: [birth year] Referring Provider: Shirlean MylarWebb, Carol, MD   Encounter Date: 11/10/2017  PT End of Session - 11/10/17 1152    Visit Number  3    Date for PT Re-Evaluation  12/29/17    Authorization Type  Medicare    PT Start Time  1149    PT Stop Time  1228    PT Time Calculation (min)  39 min    Activity Tolerance  Patient tolerated treatment well;No increased pain    Behavior During Therapy  [facility] for tasks assessed/performed       Past Medical History:  Diagnosis Date  . Atrial tachycardia (HCC)   . CFIDS (chronic fatigue and immune dysfunction syndrome) (HCC)   . Diabetes mellitus without complication (HCC)   . Endometriosis   . Fibromyalgia   . Hypertension   . Migraines   . PAF (paroxysmal atrial fibrillation) (HCC)    s/p failed ablation 2002    Past Surgical History:  Procedure Laterality Date  . ABLATION  2002   unsucessful  . BREAST BIOPSY Bilateral   . BREAST LUMPECTOMY    . PARTIAL HYSTERECTOMY      There were no vitals filed for this visit.  Subjective Assessment - 11/10/17 1150    Subjective  Pt reports that things are going good. She was suprised she was not sore after her last session. Exercises at home are going well.     Limitations  Walking;Sitting    How long can you sit comfortably?  pain with sitting to drive- limited to short trips    How long can you walk comfortably?  Rt knee pain: 15-20 minutes    Diagnostic tests  x-ray: had 2 days ago. No result yet.    Patient Stated Goals  reduce Rt knee pain, walk longer, return to Silver Sneakers    Currently in Pain?  No/denies    Pain Onset  More than a month ago                      [facility] Adult PT Treatment/Exercise - 11/10/17 0001       Knee/Hip Exercises: Aerobic   Nustep  L3 x5 min PT present to discuss HEP updates       Knee/Hip Exercises: Standing   Forward Step Up  Right;2 sets;10 reps;Hand Hold: 1;Step Height: 4" contralatleral knee drive       Knee/Hip Exercises: Seated   Long Arc Quad  2 sets;Right;10 reps    Long Arc Quad Limitations  3# dumbbells     Sit to [employer]Sand  2 sets;10 reps;without UE support;Other (comment) on foam pad       Knee/Hip Exercises: Supine   Short Arc Quad Sets  Right;1 set;15 reps;Strengthening;Limitations    Short Arc Quad Sets Limitations  3#    Bridges  2 sets;Limitations;10 reps    Bridges Limitations  green TB around knees       Knee/Hip Exercises: Prone   Hamstring Curl  2 sets;10 reps    Hamstring Curl Limitations  RLE only, double red TB              PT Education - 11/10/17 1223    Education provided  Yes    Education Details  updates to [employer]  Person(s) Educated  Patient    Methods  Explanation;Handout;Verbal cues    Comprehension  Verbalized understanding;Returned demonstration       PT Short Term Goals - 11/10/17 1228      PT SHORT TERM GOAL #1   Title  be independent in initial HEP    Time  4    Period  Weeks    Status  Achieved      PT SHORT TERM GOAL #2   Title  walk for 30 minutes without limitation due to pain    Time  4    Period  Weeks    Status  New      PT SHORT TERM GOAL #3   Title  report < or = to 4/10 Rt knee pain with sitting to drive    Time  4    Period  Weeks    Status  New      PT SHORT TERM GOAL #4   Title  demonstrate good quad set on the Rt to improve stability    Time  4    Period  Weeks    Status  New        PT Long Term Goals - 11/03/17 1207      PT LONG TERM GOAL #1   Title  be independent in advanced HEP    Time  8    Period  Weeks    Status  New    Target Date  12/29/17      PT LONG TERM GOAL #2   Title  reduce FOTO to < or = to 44% limitation    Time  8    Period  Weeks    Status  New    Target Date   12/29/17      PT LONG TERM GOAL #3   Title  perform SLR on the Rt x 10 without quad lag to improve stability    Time  8    Period  Weeks    Status  New    Target Date  12/29/17      PT LONG TERM GOAL #4   Title  sit to drive for long distances without limitation due to Rt knee pain    Time  8    Period  Weeks    Status  New    Target Date  12/29/17      PT LONG TERM GOAL #5   Title  walk for 45 minutes to 1 hour in the community without limitation due to Rt knee pain    Time  8    Period  Weeks    Status  New    Target Date  12/29/17            Plan - 11/10/17 1224    Clinical Impression Statement  Pt is consistently completing her HEP. Therapist made several updates to HEP to reflect improvements in pain and strength, introducing hip strengthening/knee with added resistance. Pt does continue to have discomfort with straight leg raise and terminal knee extension in closed and open chain positions. No reports of increased pain following today's therex progressions. Will continue with current POC.    Rehab Potential  Good    PT Frequency  2x / week    PT Duration  8 weeks    PT Treatment/Interventions  ADLs/Self Care Home Management;Cryotherapy;Electrical Stimulation;Ultrasound;Moist Heat;Gait training;Stair training;Functional mobility training;Therapeutic activities;Therapeutic exercise;Patient/family education;Neuromuscular re-education;Manual techniques;Passive range of motion;Taping;Vasopneumatic Device;Dry needling    PT Next  Visit Plan  RLE strengthening; manual to distal hamstrings as needed, ROM/flexibility    PT Home Exercise Plan  added clams and hamstring curl    Recommended Other Services  MD signed orders     Consulted and Agree with Plan of Care  Patient       Patient will benefit from skilled therapeutic intervention in order to improve the following deficits and impairments:  Pain, Abnormal gait, Increased muscle spasms, Decreased activity tolerance,  Decreased strength, Difficulty walking  Visit Diagnosis: Chronic pain of right knee  Muscle weakness (generalized)  Other abnormalities of gait and mobility     Problem List Patient Active Problem List   Diagnosis Date Noted  . Diabetes mellitus type 2, controlled, without complications (HCC) 05/24/2017  . SOB (shortness of breath) 08/29/2013  . PAF (paroxysmal atrial fibrillation) (HCC) 08/27/2013  . Paroxysmal supraventricular tachycardia (HCC) 08/27/2013  . Elevated BP 08/27/2013    12:29 PM,11/10/17 Donita Brooks PT, DPT [facility] [place]  [phone]  [facility] [address] [place], [place], [address] Phone: [phone]   Fax:  [phone]  Name: Sydney Ferguson MRN: [id number] Date of Birth: December 17, 1950

## 2017-11-10 NOTE — Patient Instructions (Signed)
   SUPINE HIP ABDUCTION - ELASTIC BAND CLAMS - CLAMSHELL  (Blue band)  Lie down on your back with your knees bent. Place an elastic band around your knees and then draw your knees apart. 2 sets of 10-15 reps     PRONE ELASTIC BAND HAMSTRING CURLS   (green band)  Attach an elastic band around your foot and opposite ankle as shown.  Next, while lying face down, slowly bend your target knee as you bring your foot towards your buttock.   Keep your other foot on the floor to fixate the band.  2 sets of 10-15 reps    [facility] [address], [address] [place], [place] [address] Phone # [phone] Fax [phone]

## 2017-11-15 ENCOUNTER — Encounter: Payer: Self-pay | Admitting: Physical Therapy

## 2017-11-15 ENCOUNTER — Ambulatory Visit: Payer: PPO | Admitting: Physical Therapy

## 2017-11-15 DIAGNOSIS — M25561 Pain in right knee: Secondary | ICD-10-CM | POA: Diagnosis not present

## 2017-11-15 DIAGNOSIS — M6281 Muscle weakness (generalized): Secondary | ICD-10-CM

## 2017-11-15 DIAGNOSIS — R2689 Other abnormalities of gait and mobility: Secondary | ICD-10-CM

## 2017-11-15 DIAGNOSIS — G8929 Other chronic pain: Secondary | ICD-10-CM

## 2017-11-15 NOTE — Patient Instructions (Signed)
   KNEE FALL OUT  STRETCH  While lying on your back with one knee bent, slowly lower your knee to the side as you stretch the inner thigh/hip area.   Hold 10 sec, repeat 10 times.    [facility] [address], [address] [place], [place] [address] Phone # [phone] Fax [phone]

## 2017-11-15 NOTE — Therapy (Signed)
[facility] [address], [address] [place], [place], [address] Phone: [phone]   Fax:  [phone]  Physical Therapy Treatment  Patient Details  Name: Sydney Ferguson MRN: [id number] Date of Birth: 07-16-1951 Referring Provider: Shirlean Mylar, MD   Encounter Date: 11/15/2017  PT End of Session - 11/15/17 1612    Visit Number  4    Date for PT Re-Evaluation  12/29/17    Authorization Type  Medicare    PT Start Time  1532    PT Stop Time  1612    PT Time Calculation (min)  40 min    Activity Tolerance  Patient tolerated treatment well;No increased pain    Behavior During Therapy  WFL for tasks assessed/performed       Past Medical History:  Diagnosis Date  . Atrial tachycardia (HCC)   . CFIDS (chronic fatigue and immune dysfunction syndrome) (HCC)   . Diabetes mellitus without complication (HCC)   . Endometriosis   . Fibromyalgia   . Hypertension   . Migraines   . PAF (paroxysmal atrial fibrillation) (HCC)    s/p failed ablation 2002    Past Surgical History:  Procedure Laterality Date  . ABLATION  2002   unsucessful  . BREAST BIOPSY Bilateral   . BREAST LUMPECTOMY    . PARTIAL HYSTERECTOMY      There were no vitals filed for this visit.  Subjective Assessment - 11/15/17 1533    Subjective  Pt reports that she has been trying to complete her HEP, although yesterday and today she didn't do them. She is getting frustrated that her knee still bothers her. She accidentally hit her knee straight on, which hurt pretty bad.     Limitations  Walking;Sitting    How long can you sit comfortably?  pain with sitting to drive- limited to short trips    How long can you walk comfortably?  Rt knee pain: 15-20 minutes    Diagnostic tests  x-ray: had 2 days ago. No result yet.    Patient Stated Goals  reduce Rt knee pain, walk longer, return to [employer]    Currently in Pain?  No/denies in the car on the way    Pain  Onset  More than a month ago                      [facility] Adult PT Treatment/Exercise - 11/15/17 0001      Knee/Hip Exercises: Stretches   Passive Hamstring Stretch  Right;2 reps;30 seconds    Other Knee/Hip Stretches  Rt hip adductor stretch 2x30 sec (standing)       Knee/Hip Exercises: Machines for Strengthening   Cybex Leg Press  Seat 7, BLE 90# 2x10 reps, RLE 30# 2x10 reps       Knee/Hip Exercises: Supine   Other Supine Knee/Hip Exercises  Rt bent knee fallout x10 reps, 5 sec hold       Knee/Hip Exercises: Sidelying   Other Sidelying Knee/Hip Exercises  Rt hip adduction 2x10 reps       Manual Therapy   Soft tissue mobilization  STM Rt medial hamstring, mid to distal adductors             PT Education - 11/15/17 1611    Education provided  Yes    Education Details  Dry needling; HEP additions     Person(s) Educated  Patient    Methods  Explanation;Handout    Comprehension  Verbalized understanding;Returned demonstration       PT Short Term Goals - 11/10/17 1228      PT SHORT TERM GOAL #1   Title  be independent in initial HEP    Time  4    Period  Weeks    Status  Achieved      PT SHORT TERM GOAL #2   Title  walk for 30 minutes without limitation due to pain    Time  4    Period  Weeks    Status  New      PT SHORT TERM GOAL #3   Title  report < or = to 4/10 Rt knee pain with sitting to drive    Time  4    Period  Weeks    Status  New      PT SHORT TERM GOAL #4   Title  demonstrate good quad set on the Rt to improve stability    Time  4    Period  Weeks    Status  New        PT Long Term Goals - 11/03/17 1207      PT LONG TERM GOAL #1   Title  be independent in advanced HEP    Time  8    Period  Weeks    Status  New    Target Date  12/29/17      PT LONG TERM GOAL #2   Title  reduce FOTO to < or = to 44% limitation    Time  8    Period  Weeks    Status  New    Target Date  12/29/17      PT LONG TERM GOAL #3   Title   perform SLR on the Rt x 10 without quad lag to improve stability    Time  8    Period  Weeks    Status  New    Target Date  12/29/17      PT LONG TERM GOAL #4   Title  sit to drive for long distances without limitation due to Rt knee pain    Time  8    Period  Weeks    Status  New    Target Date  12/29/17      PT LONG TERM GOAL #5   Title  walk for 45 minutes to 1 hour in the community without limitation due to Rt knee pain    Time  8    Period  Weeks    Status  New    Target Date  12/29/17            Plan - 11/15/17 1612    Clinical Impression Statement  Pt continues to note Rt medial knee pain throughout the day. Introduced leg press this visit with light resistance, noting slight increase in medial knee pain overall. Completed manual techniques to address hamstring and adductor muscle spasm, noting palpable trigger points in the area. Therapist discussed implications of dry needling and will consider this next session to further address muscle restrictions and pain. Pt was agreeable to this and noted no increase in pain end of session.    Rehab Potential  Good    PT Frequency  2x / week    PT Duration  8 weeks    PT Treatment/Interventions  ADLs/Self Care Home Management;Cryotherapy;Electrical Stimulation;Ultrasound;Moist Heat;Gait training;Stair training;Functional mobility training;Therapeutic activities;Therapeutic exercise;Patient/family education;Neuromuscular re-education;Manual techniques;Passive range of motion;Taping;Vasopneumatic Device;Dry needling  PT Next Visit Plan  DN Rt medial hamstring/adductors; RLE strengthening; manual to distal hamstrings as needed, ROM/flexibility    PT Home Exercise Plan  added clams and hamstring curl    Consulted and Agree with Plan of Care  Patient       Patient will benefit from skilled therapeutic intervention in order to improve the following deficits and impairments:  Pain, Abnormal gait, Increased muscle spasms, Decreased  activity tolerance, Decreased strength, Difficulty walking  Visit Diagnosis: Chronic pain of right knee  Muscle weakness (generalized)  Other abnormalities of gait and mobility     Problem List Patient Active Problem List   Diagnosis Date Noted  . Diabetes mellitus type 2, controlled, without complications (HCC) 05/24/2017  . SOB (shortness of breath) 08/29/2013  . PAF (paroxysmal atrial fibrillation) (HCC) 08/27/2013  . Paroxysmal supraventricular tachycardia (HCC) 08/27/2013  . Elevated BP 08/27/2013   4:19 PM,11/15/17 Donita Brooks PT, DPT [facility]  [phone]  [facility] [address] [place], [place], [address] Phone: [phone]   Fax:  [phone]  Name: Sydney Ferguson MRN: [id number] Date of Birth: 11-26-1950

## 2017-11-17 ENCOUNTER — Ambulatory Visit: Payer: PPO

## 2017-11-17 DIAGNOSIS — M25561 Pain in right knee: Principal | ICD-10-CM

## 2017-11-17 DIAGNOSIS — G8929 Other chronic pain: Secondary | ICD-10-CM

## 2017-11-17 DIAGNOSIS — R2689 Other abnormalities of gait and mobility: Secondary | ICD-10-CM

## 2017-11-17 DIAGNOSIS — M6281 Muscle weakness (generalized): Secondary | ICD-10-CM

## 2017-11-17 NOTE — Therapy (Signed)
[facility] [address] [place], [place], [address] Phone: [phone]   Fax:  [phone]  Physical Therapy Treatment  Patient Details  Name: Sydney Ferguson MRN: [id number] Date of Birth: August 11, 1951 Referring Provider: Shirlean Mylar, MD   Encounter Date: 11/17/2017  PT End of Session - 11/17/17 1018    Visit Number  5    Date for PT Re-Evaluation  12/29/17    Authorization Type  Medicare    PT Start Time  0930    PT Stop Time  1022    PT Time Calculation (min)  52 min    Activity Tolerance  Patient tolerated treatment well    Behavior During Therapy  [facility] for tasks assessed/performed       Past Medical History:  Diagnosis Date  . Atrial tachycardia (HCC)   . CFIDS (chronic fatigue and immune dysfunction syndrome) (HCC)   . Diabetes mellitus without complication (HCC)   . Endometriosis   . Fibromyalgia   . Hypertension   . Migraines   . PAF (paroxysmal atrial fibrillation) (HCC)    s/p failed ablation 2002    Past Surgical History:  Procedure Laterality Date  . ABLATION  2002   unsucessful  . BREAST BIOPSY Bilateral   . BREAST LUMPECTOMY    . PARTIAL HYSTERECTOMY      There were no vitals filed for this visit.  Subjective Assessment - 11/17/17 0929    Subjective  I'm doing OK.  I have been more consistent with HEP.      Currently in Pain?  Yes    Pain Score  6     Pain Location  Knee    Pain Orientation  Right;Medial    Pain Descriptors / Indicators  Aching;Throbbing    Pain Type  Chronic pain    Pain Onset  More than a month ago    Pain Frequency  Constant    Aggravating Factors   standing too long, walking on hard floor    Pain Relieving Factors  medication, rest, ice                      [facility] Adult PT Treatment/Exercise - 11/17/17 0001      Knee/Hip Exercises: Stretches   Active Hamstring Stretch  Right;3 reps;20 seconds      Knee/Hip Exercises: Aerobic   Nustep   Level 2x 6 minutes      Knee/Hip Exercises: Machines for Strengthening   Cybex Leg Press  Seat 7, BLE 90# 2x10 reps, RLE 30# 2x10 reps       Modalities   Modalities  Moist Heat      Moist Heat Therapy   Number Minutes Moist Heat  15 Minutes    Moist Heat Location  Knee      Manual Therapy   Manual Therapy  Soft tissue mobilization;Joint mobilization    Manual therapy comments  soft tissue elongation to Rt medial hamstrings and adductors with trigger point release       Trigger Point Dry Needling - 11/17/17 0932    Consent Given?  Yes    Education Handout Provided  Yes    Muscles Treated Lower Body  Hamstring;Adductor longus/brevius/maximus Rt only    Adductor Response  Twitch response elicited;Palpable increased muscle length    Hamstring Response  Twitch response elicited;Palpable increased muscle length           PT Education - 11/17/17 0934    Education  provided  Yes    Education Details  DN info    Person(s) Educated  Patient    Methods  Explanation;Handout    Comprehension  Verbalized understanding       PT Short Term Goals - 11/10/17 1228      PT SHORT TERM GOAL #1   Title  be independent in initial HEP    Time  4    Period  Weeks    Status  Achieved      PT SHORT TERM GOAL #2   Title  walk for 30 minutes without limitation due to pain    Time  4    Period  Weeks    Status  New      PT SHORT TERM GOAL #3   Title  report < or = to 4/10 Rt knee pain with sitting to drive    Time  4    Period  Weeks    Status  New      PT SHORT TERM GOAL #4   Title  demonstrate good quad set on the Rt to improve stability    Time  4    Period  Weeks    Status  New        PT Long Term Goals - 11/03/17 1207      PT LONG TERM GOAL #1   Title  be independent in advanced HEP    Time  8    Period  Weeks    Status  New    Target Date  12/29/17      PT LONG TERM GOAL #2   Title  reduce FOTO to < or = to 44% limitation    Time  8    Period  Weeks    Status   New    Target Date  12/29/17      PT LONG TERM GOAL #3   Title  perform SLR on the Rt x 10 without quad lag to improve stability    Time  8    Period  Weeks    Status  New    Target Date  12/29/17      PT LONG TERM GOAL #4   Title  sit to drive for long distances without limitation due to Rt knee pain    Time  8    Period  Weeks    Status  New    Target Date  12/29/17      PT LONG TERM GOAL #5   Title  walk for 45 minutes to 1 hour in the community without limitation due to Rt knee pain    Time  8    Period  Weeks    Status  New    Target Date  12/29/17            Plan - 11/17/17 0937    Clinical Impression Statement  Pt with incresaed pain in the medial Rt knee pain today.  Pt has not yet returned to [employer] and plans to do this soon.  Pt is able to perform all exercise in clinic without increased pain.  Pt with trigger points and tension in Rt adductors and hamstrings.  Pt demonstrated improved tissue mobility after dry needling today.  Pt will conitnue with HEP for strength and flexibility at home.  Pt will continue to benefit from skilled PT to address Rt medial knee and joint pain to improve ability to return to exercise and standing function wihtout limitaiton.  Rehab Potential  Good    PT Frequency  2x / week    PT Duration  8 weeks    PT Treatment/Interventions  ADLs/Self Care Home Management;Cryotherapy;Electrical Stimulation;Ultrasound;Moist Heat;Gait training;Stair training;Functional mobility training;Therapeutic activities;Therapeutic exercise;Patient/family education;Neuromuscular re-education;Manual techniques;Passive range of motion;Taping;Vasopneumatic Device;Dry needling    PT Next Visit Plan  assess response to DN Rt medial hamstring/adductors; RLE strengthening; manual to distal hamstrings as needed, ROM/flexibility    Consulted and Agree with Plan of Care  Patient       Patient will benefit from skilled therapeutic intervention in order to  improve the following deficits and impairments:  Pain, Abnormal gait, Increased muscle spasms, Decreased activity tolerance, Decreased strength, Difficulty walking  Visit Diagnosis: Chronic pain of right knee  Muscle weakness (generalized)  Other abnormalities of gait and mobility     Problem List Patient Active Problem List   Diagnosis Date Noted  . Diabetes mellitus type 2, controlled, without complications (HCC) 05/24/2017  . SOB (shortness of breath) 08/29/2013  . PAF (paroxysmal atrial fibrillation) (HCC) 08/27/2013  . Paroxysmal supraventricular tachycardia (HCC) 08/27/2013  . Elevated BP 08/27/2013    Lorrene Reid, PT 11/17/17 10:23 AM  [facility] [address], [address] [place], [place], [address] Phone: [phone]   Fax:  [phone]  Name: Sydney Ferguson MRN: [id number] Date of Birth: Sep 27, 1951

## 2017-11-17 NOTE — Patient Instructions (Addendum)

## 2017-11-21 ENCOUNTER — Encounter: Payer: Self-pay | Admitting: Physical Therapy

## 2017-11-21 ENCOUNTER — Ambulatory Visit: Payer: PPO | Admitting: Physical Therapy

## 2017-11-21 DIAGNOSIS — M6281 Muscle weakness (generalized): Secondary | ICD-10-CM

## 2017-11-21 DIAGNOSIS — M25561 Pain in right knee: Secondary | ICD-10-CM | POA: Diagnosis not present

## 2017-11-21 DIAGNOSIS — R2689 Other abnormalities of gait and mobility: Secondary | ICD-10-CM

## 2017-11-21 DIAGNOSIS — G8929 Other chronic pain: Secondary | ICD-10-CM

## 2017-11-21 NOTE — Therapy (Signed)
[facility] [address] [place], [place], [address] Phone: [phone]   Fax:  [phone]  Physical Therapy Treatment  Patient Details  Name: Sydney Ferguson MRN: [id number] Date of Birth: [birth year] Referring Provider: Shirlean Mylar, MD   Encounter Date: 11/21/2017  PT End of Session - 11/21/17 1343    Visit Number  6    Date for PT Re-Evaluation  12/29/17    Authorization Type  Medicare    PT Start Time  0931    PT Stop Time  1012    PT Time Calculation (min)  41 min    Activity Tolerance  Patient tolerated treatment well;No increased pain    Behavior During Therapy  WFL for tasks assessed/performed       Past Medical History:  Diagnosis Date  . Atrial tachycardia (HCC)   . CFIDS (chronic fatigue and immune dysfunction syndrome) (HCC)   . Diabetes mellitus without complication (HCC)   . Endometriosis   . Fibromyalgia   . Hypertension   . Migraines   . PAF (paroxysmal atrial fibrillation) (HCC)    s/p failed ablation 2002    Past Surgical History:  Procedure Laterality Date  . ABLATION  2002   unsucessful  . BREAST BIOPSY Bilateral   . BREAST LUMPECTOMY    . PARTIAL HYSTERECTOMY      There were no vitals filed for this visit.  Subjective Assessment - 11/21/17 1103    Subjective  Pt reports that things are going well. 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     Currently in Pain?  Yes    Pain Score  5     Pain Location  Knee    Pain Orientation  Right;Medial    Pain Descriptors / Indicators  Aching    Pain Type  Chronic pain    Pain Radiating Towards  none     Pain Onset  More than a month ago    Pain Frequency  Constant    Aggravating Factors   walking on hard floor    Pain Relieving Factors  medication, rest and ice                      [facility] Adult PT Treatment/Exercise - 11/21/17 0001      Knee/Hip  Exercises: Stretches   Hip Flexor Stretch  Right;3 reps;30 seconds      Knee/Hip Exercises: Standing   Hip Extension  Right;20 reps;1 set    Extension Limitations  10 reps isometric hip extension hold with hamstring curl x10 reps     Other Standing Knee Exercises  closed chain adductor slide x10 reps each      Knee/Hip Exercises: Sidelying   Hip ABduction  Right;2 sets;10 reps;Limitations;Strengthening    Hip ABduction Limitations  2# dumbbells     Hip ADduction  Right;2 sets;10 reps      Knee/Hip Exercises: Prone   Hamstring Curl  2 sets;10 reps    Hamstring Curl Limitations  RLE only, single green, eccentric only     Hip Extension  Right;2 sets;10 reps;Limitations    Hip Extension Limitations  2# dumbbells       Manual Therapy   Soft tissue mobilization  STM Rt medial hamstring, Rt distal adductors              PT Education - 11/21/17 1343    Education provided  Yes    Education Details  technique with therex     Person(s) Educated  Patient    Methods  Explanation;Verbal cues;Handout    Comprehension  Verbalized understanding;Returned demonstration       PT Short Term Goals - 11/21/17 1347      PT SHORT TERM GOAL #1   Title  be independent in initial HEP    Time  4    Period  Weeks    Status  Achieved      PT SHORT TERM GOAL #2   Title  walk for 30 minutes without limitation due to pain    Time  4    Period  Weeks    Status  On-going      PT SHORT TERM GOAL #3   Title  report < or = to 4/10 Rt knee pain with sitting to drive    Time  4    Period  Weeks    Status  On-going      PT SHORT TERM GOAL #4   Title  demonstrate good quad set on the Rt to improve stability    Time  4    Period  Weeks    Status  Achieved        PT Long Term Goals - 11/03/17 1207      PT LONG TERM GOAL #1   Title  be independent in advanced HEP    Time  8    Period  Weeks    Status  New    Target Date  12/29/17      PT LONG TERM GOAL #2   Title  reduce FOTO to < or  = to 44% limitation    Time  8    Period  Weeks    Status  New    Target Date  12/29/17      PT LONG TERM GOAL #3   Title  perform SLR on the Rt x 10 without quad lag to improve stability    Time  8    Period  Weeks    Status  New    Target Date  12/29/17      PT LONG TERM GOAL #4   Title  sit to drive for long distances without limitation due to Rt knee pain    Time  8    Period  Weeks    Status  New    Target Date  12/29/17      PT LONG TERM GOAL #5   Title  walk for 45 minutes to 1 hour in the community without limitation due to Rt knee pain    Time  8    Period  Weeks    Status  New    Target Date  12/29/17            Plan - 11/21/17 1343    Clinical Impression Statement  Pt continues to have pain along the medial aspect of her knee. Unsure of any improvements in pain following needling last session. Continued with therex to increase pt's hip strength, in addition to manual treatment of the medial knee to decrease spasm and pain. Pt does have hip weakness, evident by functional strengthening completed this session, and several additions were made to her HEP to address this. Pt reported no increase in pain end of session.     Rehab Potential  Good    PT Frequency  2x / week    PT Duration  8  weeks    PT Treatment/Interventions  ADLs/Self Care Home Management;Cryotherapy;Electrical Stimulation;Ultrasound;Moist Heat;Gait training;Stair training;Functional mobility training;Therapeutic activities;Therapeutic exercise;Patient/family education;Neuromuscular re-education;Manual techniques;Passive range of motion;Taping;Vasopneumatic Device;Dry needling    PT Next Visit Plan  f/u on gym program; RLE strengthening progression; manual to distal hamstrings as needed, ROM/flexibility    Consulted and Agree with Plan of Care  Patient       Patient will benefit from skilled therapeutic intervention in order to improve the following deficits and impairments:  Pain, Abnormal gait,  Increased muscle spasms, Decreased activity tolerance, Decreased strength, Difficulty walking  Visit Diagnosis: Chronic pain of right knee  Muscle weakness (generalized)  Other abnormalities of gait and mobility     Problem List Patient Active Problem List   Diagnosis Date Noted  . Diabetes mellitus type 2, controlled, without complications (HCC) 05/24/2017  . SOB (shortness of breath) 08/29/2013  . PAF (paroxysmal atrial fibrillation) (HCC) 08/27/2013  . Paroxysmal supraventricular tachycardia (HCC) 08/27/2013  . Elevated BP 08/27/2013    1:49 PM,11/21/17 Donita Brooks PT, DPT [facility]  [phone]  [facility] [address] [place], [place], [address] Phone: [phone]   Fax:  [phone]  Name: MARIACHRISTINA HOLLE MRN: [id number] Date of Birth: 1951/03/16

## 2017-11-28 ENCOUNTER — Ambulatory Visit: Payer: PPO | Attending: Family Medicine

## 2017-11-28 DIAGNOSIS — R232 Flushing: Secondary | ICD-10-CM | POA: Diagnosis not present

## 2017-11-28 DIAGNOSIS — M6281 Muscle weakness (generalized): Secondary | ICD-10-CM | POA: Diagnosis not present

## 2017-11-28 DIAGNOSIS — G8929 Other chronic pain: Secondary | ICD-10-CM | POA: Insufficient documentation

## 2017-11-28 DIAGNOSIS — R2689 Other abnormalities of gait and mobility: Secondary | ICD-10-CM | POA: Insufficient documentation

## 2017-11-28 DIAGNOSIS — M25561 Pain in right knee: Secondary | ICD-10-CM | POA: Diagnosis not present

## 2017-11-28 DIAGNOSIS — E119 Type 2 diabetes mellitus without complications: Secondary | ICD-10-CM | POA: Diagnosis not present

## 2017-11-28 DIAGNOSIS — E782 Mixed hyperlipidemia: Secondary | ICD-10-CM | POA: Diagnosis not present

## 2017-11-28 NOTE — Therapy (Signed)
[facility] [address], [address] [place], [place], [address] Phone: [phone]   Fax:  [phone]  Physical Therapy Treatment  Patient Details  Name: Sydney Ferguson MRN: [id number] Date of Birth: [birth year] Referring Provider: Shirlean MylarWebb, Carol, MD   Encounter Date: 11/28/2017  PT End of Session - 11/28/17 1141    Visit Number  7    Date for PT Re-Evaluation  12/29/17    Authorization Type  Medicare    PT Start Time  1055    PT Stop Time  1141    PT Time Calculation (min)  46 min    Activity Tolerance  Patient tolerated treatment well    Behavior During Therapy  [facility] tasks assessed/performed       Past Medical History:  Diagnosis Date  . Atrial tachycardia (HCC)   . CFIDS (chronic fatigue and immune dysfunction syndrome) (HCC)   . Diabetes mellitus without complication (HCC)   . Endometriosis   . Fibromyalgia   . Hypertension   . Migraines   . PAF (paroxysmal atrial fibrillation) (HCC)    s/p failed ablation 2002    Past Surgical History:  Procedure Laterality Date  . ABLATION  2002   unsucessful  . BREAST BIOPSY Bilateral   . BREAST LUMPECTOMY    . PARTIAL HYSTERECTOMY      There were no vitals filed for this visit.  Subjective Assessment - 11/28/17 1057    Subjective  I just went to the MD this morning.  She wants me to try 2 more weeks of PT to see if I can see progress and start taking Meloxicam.  I can't see much difference in my symptoms.    Currently in Pain?  Yes    Pain Score  5     Pain Location  Knee    Pain Orientation  Right;Medial    Pain Descriptors / Indicators  Aching    Pain Type  Chronic pain    Pain Onset  More than a month ago    Pain Frequency  Constant    Aggravating Factors   doing a lot of walking, standing/walking on hard floor, sitting with knees bent    Pain Relieving Factors  elevating legs, rest, medication, ice                      [facility] Adult PT  Treatment/Exercise - 11/28/17 0001      Knee/Hip Exercises: Stretches   Active Hamstring Stretch  Right;3 reps;20 seconds    Hip Flexor Stretch  Right;3 reps;30 seconds    Gastroc Stretch  Right;20 seconds;3 reps using rocker board      Knee/Hip Exercises: Aerobic   Nustep  Level 2x 6 minutes PT present to discuss progress      Knee/Hip Exercises: Machines for Strengthening   Cybex Leg Press  Seat 7, BLE 90# 2x10 reps, RLE 30# 2x10 reps       Knee/Hip Exercises: Standing   Hip Abduction  Stengthening;Both;2 sets;10 reps    Hip Extension  Stengthening;Left;Right;Both;2 sets;10 reps;Knee straight      Knee/Hip Exercises: Sidelying   Hip ABduction  Right;2 sets;10 reps;Limitations;Strengthening;Left    Hip ABduction Limitations  2#      Knee/Hip Exercises: Prone   Hamstring Curl  2 sets;10 reps    Hamstring Curl Limitations  Rt & Lt single green, eccentric only     Hip Extension  Right;2 sets;10 reps;Limitations;Left    Hip Extension  Limitations  2#               PT Short Term Goals - 11/28/17 1129      PT SHORT TERM GOAL #2   Title  walk for 30 minutes without limitation due to pain    Baseline  30-40 minutes    Status  Achieved        PT Long Term Goals - 11/03/17 1207      PT LONG TERM GOAL #1   Title  be independent in advanced HEP    Time  8    Period  Weeks    Status  New    Target Date  12/29/17      PT LONG TERM GOAL #2   Title  reduce FOTO to < or = to 44% limitation    Time  8    Period  Weeks    Status  New    Target Date  12/29/17      PT LONG TERM GOAL #3   Title  perform SLR on the Rt x 10 without quad lag to improve stability    Time  8    Period  Weeks    Status  New    Target Date  12/29/17      PT LONG TERM GOAL #4   Title  sit to drive for long distances without limitation due to Rt knee pain    Time  8    Period  Weeks    Status  New    Target Date  12/29/17      PT LONG TERM GOAL #5   Title  walk for 45 minutes to 1 hour  in the community without limitation due to Rt knee pain    Time  8    Period  Weeks    Status  New    Target Date  12/29/17            Plan - 11/28/17 1059    Clinical Impression Statement  Pt reports that she is not having much change in her symptoms.  Pt is limited to walking and standing on hard floors/surfaces.  Rt knee remains limited in sitting with knee bent when sitting in a chair and driving.  Pt does report that she is able to walk 30-40 minutes now and this is improved from initial evaluation.  Pt will begin to take Meloxicam again per MD advice.  Pt with hip weekness noted with hip strengthing in the clinic today.  Pt required tactile and demo cues for supine and prone open chain hip exercises to reduce substitution.  Pt with tension and trigger points in Rt hamstrings today with manual therapy.  Pt will continue to benefit from skilled PT for LE strength, endurance, gait and manual therapy to reduce pain with functional mobility.     Rehab Potential  Good    PT Frequency  2x / week    PT Duration  8 weeks    PT Treatment/Interventions  ADLs/Self Care Home Management;Cryotherapy;Electrical Stimulation;Ultrasound;Moist Heat;Gait training;Stair training;Functional mobility training;Therapeutic activities;Therapeutic exercise;Patient/family education;Neuromuscular re-education;Manual techniques;Passive range of motion;Taping;Vasopneumatic Device;Dry needling    PT Next Visit Plan  LE strength and flexibility. RLE strengthening progression; manual to distal hamstrings as needed, ROM/flexibility    Consulted and Agree with Plan of Care  Patient       Patient will benefit from skilled therapeutic intervention in order to improve the following deficits and impairments:  Pain, Abnormal gait, Increased  muscle spasms, Decreased activity tolerance, Decreased strength, Difficulty walking  Visit Diagnosis: Chronic pain of right knee  Muscle weakness (generalized)  Other abnormalities of  gait and mobility     Problem List Patient Active Problem List   Diagnosis Date Noted  . Diabetes mellitus type 2, controlled, without complications (HCC) 05/24/2017  . SOB (shortness of breath) 08/29/2013  . PAF (paroxysmal atrial fibrillation) (HCC) 08/27/2013  . Paroxysmal supraventricular tachycardia (HCC) 08/27/2013  . Elevated BP 08/27/2013     Lorrene Reid, PT 11/28/17 11:52 AM  [facility] [address] [place], [place], [address] Phone: [phone]   Fax:  [phone]  Name: Sydney Ferguson MRN: [id number] Date of Birth: 10/09/1950

## 2017-12-07 ENCOUNTER — Ambulatory Visit: Payer: PPO | Admitting: Physical Therapy

## 2017-12-09 ENCOUNTER — Encounter: Payer: Self-pay | Admitting: Physical Therapy

## 2017-12-09 ENCOUNTER — Ambulatory Visit: Payer: PPO | Admitting: Physical Therapy

## 2017-12-09 DIAGNOSIS — M25561 Pain in right knee: Secondary | ICD-10-CM | POA: Diagnosis not present

## 2017-12-09 DIAGNOSIS — M6281 Muscle weakness (generalized): Secondary | ICD-10-CM

## 2017-12-09 DIAGNOSIS — G8929 Other chronic pain: Secondary | ICD-10-CM

## 2017-12-09 DIAGNOSIS — R2689 Other abnormalities of gait and mobility: Secondary | ICD-10-CM

## 2017-12-09 NOTE — Therapy (Addendum)
[facility] [address], [place], [place], [address] Phone: [phone]   Fax:  [phone]  Physical Therapy Treatment  Patient Details  Name: Sydney Ferguson MRN: [id number] Date of Birth: 08-31-1951 Referring Provider: Maurice Small, MD   Encounter Date: 12/09/2017  PT End of Session - 12/09/17 1027    Visit Number  8    Date for PT Re-Evaluation  12/29/17    Authorization Type  Medicare    PT Start Time  1018    PT Stop Time  1058    PT Time Calculation (min)  40 min    Activity Tolerance  Patient tolerated treatment well;No increased pain    Behavior During Therapy  WFL for tasks assessed/performed       Past Medical History:  Diagnosis Date  . Atrial tachycardia ([place])   . CFIDS (chronic fatigue and immune dysfunction syndrome) ([place])   . Diabetes mellitus without complication ([place])   . Endometriosis   . Fibromyalgia   . Hypertension   . Migraines   . PAF (paroxysmal atrial fibrillation) ([place])    s/p failed ablation 2002    Past Surgical History:  Procedure Laterality Date  . ABLATION  2002   unsucessful  . BREAST BIOPSY Bilateral   . BREAST LUMPECTOMY    . PARTIAL HYSTERECTOMY      There were no vitals filed for this visit.  Subjective Assessment - 12/09/17 1020    Subjective  Pt reports that things are going much better. She had a cold for a while and is getting over that. She thinks the medication is helping some because overall her pain is not as bad.     Currently in Pain?  Yes    Pain Score  -- 3-4 out of 10    Pain Location  Knee    Pain Orientation  Right;Medial    Pain Descriptors / Indicators  Aching;Sore    Pain Type  Chronic pain    Pain Radiating Towards  none     Pain Onset  More than a month ago    Pain Frequency  Intermittent    Aggravating Factors   doing alot of walking, sitting too long with knees bent     Pain Relieving Factors  medication, ice and rest                        [facility] Adult PT Treatment/Exercise - 12/09/17 0001      Knee/Hip Exercises: Aerobic   Nustep  L2 x5 min, PT present to discuss session       Knee/Hip Exercises: Machines for Strengthening   Cybex Leg Press  Seat 7, BLE #100 2x10 reps, RLE only #40 2x10 reps        Knee/Hip Exercises: Standing   Hip Extension  Both;2 sets;10 reps;Stengthening    Other Standing Knee Exercises  closed chain eccentric adductor slide x10 reps on each LE with yellow TB resistance     Other Standing Knee Exercises  Rt hamstring curl with 2# ankle weight 2x10 reps       Knee/Hip Exercises: Sidelying   Hip ABduction  Right;2 sets;10 reps    Hip ABduction Limitations  3#    Hip ADduction  Right;2 sets;10 reps cues to maintian knee extension      Manual Therapy   Soft tissue mobilization  STM Rt hamstring during passive stretch  PT Education - 12/09/17 1044    Education provided  Yes    Education Details  technique with therex; self massage to hamstrings with tennis ball     Person(s) Educated  Patient    Methods  Tactile cues;Verbal cues;Explanation    Comprehension  Verbalized understanding;Returned demonstration       PT Short Term Goals - 11/28/17 1129      PT SHORT TERM GOAL #2   Title  walk for 30 minutes without limitation due to pain    Baseline  30-40 minutes    Status  Achieved        PT Long Term Goals - 11/03/17 1207      PT LONG TERM GOAL #1   Title  be independent in advanced HEP    Time  8    Period  Weeks    Status  New    Target Date  12/29/17      PT LONG TERM GOAL #2   Title  reduce FOTO to < or = to 44% limitation    Time  8    Period  Weeks    Status  New    Target Date  12/29/17      PT LONG TERM GOAL #3   Title  perform SLR on the Rt x 10 without quad lag to improve stability    Time  8    Period  Weeks    Status  New    Target Date  12/29/17      PT LONG TERM GOAL #4   Title  sit to drive for long  distances without limitation due to Rt knee pain    Time  8    Period  Weeks    Status  New    Target Date  12/29/17      PT LONG TERM GOAL #5   Title  walk for 45 minutes to 1 hour in the community without limitation due to Rt knee pain    Time  8    Period  Weeks    Status  New    Target Date  12/29/17            Plan - 12/09/17 1045    Clinical Impression Statement  Pt arrived with overall improvements in knee pain. Due to being out and recovering from sickness, only minor increases were made to pt's exercises this session. She was able to complete exercises without increase in knee pain. Encouraged pt to get back into HEP moving forward and will plan to follow up with her once she has returned from her trip to the beach.     Rehab Potential  Good    PT Frequency  2x / week    PT Duration  8 weeks    PT Treatment/Interventions  ADLs/Self Care Home Management;Cryotherapy;Electrical Stimulation;Ultrasound;Moist Heat;Gait training;Stair training;Functional mobility training;Therapeutic activities;Therapeutic exercise;Patient/family education;Neuromuscular re-education;Manual techniques;Passive range of motion;Taping;Vasopneumatic Device;Dry needling    PT Next Visit Plan  LE strength and flexibility. RLE strengthening progression; manual to distal hamstrings as needed, ROM/flexibility    Consulted and Agree with Plan of Care  Patient       Patient will benefit from skilled therapeutic intervention in order to improve the following deficits and impairments:  Pain, Abnormal gait, Increased muscle spasms, Decreased activity tolerance, Decreased strength, Difficulty walking  Visit Diagnosis: Chronic pain of right knee  Muscle weakness (generalized)  Other abnormalities of gait and mobility     Problem List  Patient Active Problem List   Diagnosis Date Noted  . Diabetes mellitus type 2, controlled, without complications ([place]) 19/16/6060  . SOB (shortness of breath) 08/29/2013   . PAF (paroxysmal atrial fibrillation) ([place]) 08/27/2013  . Paroxysmal supraventricular tachycardia ([place]) 08/27/2013  . Elevated BP 08/27/2013    11:00 AM,12/09/17 Sherol Dade PT, DPT [facility] PHYSICAL THERAPY DISCHARGE SUMMARY  Visits from Start of Care: 8  Current functional level related to goals / functional outcomes: Pt didn't return to PT after 12/09/17.  See above for most current status.     Remaining deficits: See above for current PT status and goal assessment.     Education / Equipment: HEP Plan: Patient agrees to discharge.  Patient goals were not met. Patient is being discharged due to not returning since the last visit.  ?????        Sigurd Sos, PT 01/17/18 8:17 AM    [facility] [address] [place], [place], [address] Phone: [phone]   Fax:  [phone]  Name: DOMINIGUE GELLNER MRN: [id number] Date of Birth: 03/31/1951

## 2017-12-23 DIAGNOSIS — J209 Acute bronchitis, unspecified: Secondary | ICD-10-CM | POA: Diagnosis not present

## 2017-12-23 DIAGNOSIS — M25561 Pain in right knee: Secondary | ICD-10-CM | POA: Diagnosis not present

## 2018-01-11 DIAGNOSIS — M797 Fibromyalgia: Secondary | ICD-10-CM | POA: Insufficient documentation

## 2018-01-11 DIAGNOSIS — M1711 Unilateral primary osteoarthritis, right knee: Secondary | ICD-10-CM | POA: Diagnosis not present

## 2018-01-11 DIAGNOSIS — M25561 Pain in right knee: Secondary | ICD-10-CM | POA: Diagnosis not present

## 2018-02-01 DIAGNOSIS — M25551 Pain in right hip: Secondary | ICD-10-CM | POA: Diagnosis not present

## 2018-02-01 DIAGNOSIS — M1711 Unilateral primary osteoarthritis, right knee: Secondary | ICD-10-CM | POA: Diagnosis not present

## 2018-02-08 ENCOUNTER — Other Ambulatory Visit: Payer: Self-pay

## 2018-02-08 ENCOUNTER — Ambulatory Visit: Payer: PPO | Admitting: Neurology

## 2018-02-08 ENCOUNTER — Encounter: Payer: Self-pay | Admitting: Neurology

## 2018-02-08 VITALS — BP 136/88 | HR 83 | Ht 69.5 in | Wt 218.0 lb

## 2018-02-08 DIAGNOSIS — R413 Other amnesia: Secondary | ICD-10-CM

## 2018-02-08 NOTE — Progress Notes (Signed)
NEUROLOGY FOLLOW UP OFFICE NOTE  Sydney Ferguson [id number] [birth year]  HISTORY OF PRESENT ILLNESS: I had the pleasure of seeing Sydney Ferguson in follow-up in the neurology clinic on 02/08/2018.  The patient was last seen 6 months ago for worsening memory and word-finding difficulties. She is again accompanied by her husband who helps supplement the history today.  Records and images were personally reviewed where available.  TSH normal, B12 low normal 245. She and her husband report that memory has been stable. She has been dealing with tremendous right knee pain, and has noticed the pain causes her to be distracted and cannot think straight. She gets depressed and angry because of it. She gets easily agitated because she is in pain. She is looking forward to getting injections and potentially aquatic therapy. She has not been driving due to her knee, and being at home makes her more depressed. Her husband is in charge of bills. She has a pillbox and denies missing medications. She has occasional dizziness without clear triggers. She has had balance issues when standing, no falls. She denies any headaches. Her neuropathy is "horrible," she bruised her heel without realizing how she injured it. She has sharp needle pains in her feet. Gabapentin recently increased to  BID, she was also reporting hot flashes. She has noticed she is more shaky.  HPI 08/04/2017: This is a 67 yo RH woman with a history of atrial fibrillation, migraines, fibromyalgia, chronic fatigue syndrome, presenting for evaluation of worsening memory and neuropathy. She is very concerned due to strong family history of Alzheimer's disease in her family. Her mother and 6 maternal aunts had dementia. She started noticing word-finding difficulties around a year ago, not being able to find the right words, saying "cow" when thinking of "dog." Around 2 months ago, she put water for coffee in a different spot, which is similar to what  her mother had done in the past, concerning her. Around 2 weeks ago, she got lost driving to her sister's house. She denies getting lost driving. She is pretty good with managing medications. Her husband is in charge of finances. She loses things a lot, forgetting what she was looking for. They endorse high stress over the past year taking care of her mother, with her parents passing away within a short span of each other. Her husband started noticing changes around a year ago, mostly with talking then hesitating looking for a word. He would end up finishing her sentences. No personality changes or hallucinations. No difficulties with ADLs. She denies any history of concussions. She has had a couple of drinks every night for the past 30 years.   She started having numbness, tingling, and sharp pain in both feet around 2-3 years ago. She saw Dr. Foy Ferguson who felt the symptoms were related to her fibromyalgia. She saw her new PCP Dr. Hyman Ferguson and was found to have diabetes. Her last HbA1c was 7 (improved from initial diagnosis). She reports paresthesias are constant, gabapentin  qhs helps. She is also on Cymbalta for the fibromyalgia, she cannot walk if she misses a dose. Symptoms affect her foot up to the ankles. Hands are not affected. She denies any falls. She has rare migraines. She has some dizziness when standing. She gets choked a lot when swallowing. She has a lot of neck pain, some back pain. No diplopia, dysarthria, bowel/bladder dysfunction, anosmia, or tremors.   PAST MEDICAL HISTORY: Past Medical History:  Diagnosis Date  . Atrial tachycardia (HCC)   .  CFIDS (chronic fatigue and immune dysfunction syndrome) (HCC)   . Diabetes mellitus without complication (HCC)   . Endometriosis   . Fibromyalgia   . Hypertension   . Migraines   . PAF (paroxysmal atrial fibrillation) (HCC)    s/p failed ablation 2002    MEDICATIONS: Current Outpatient Medications on File Prior to Visit  Medication Sig  Dispense Refill  . ALPRAZolam (XANAX) 0.5 MG tablet Take 0.5 mg by mouth daily as needed for anxiety.   1  . BIOTIN 5000 PO Take 1 capsule by mouth daily.    . cetirizine (ZYRTEC) 10 MG tablet Take 10 mg by mouth daily.    . ciclesonide (OMNARIS) 50 MCG/ACT nasal spray Place 2 sprays into both nostrils daily as needed for allergies.     . clonazePAM (KLONOPIN) 1 MG tablet Take 1 mg by mouth daily.    . cyclobenzaprine (FLEXERIL) 10 MG tablet TAKE 1 TABLET BY MOUTH AT BEDTIME  0  . DULoxetine (CYMBALTA) 60 MG capsule TAKE 1 CAPSULE ONCE A DAY  3  . flecainide (TAMBOCOR) 100 MG tablet TAKE 1 TABLET BY MOUTH TWICE A DAY 60 tablet 10  . gabapentin (NEURONTIN) 100 MG capsule Take 100 mg by mouth 3 (three) times daily. Take  by mouth at night  11  . glipiZIDE (GLUCOTROL XL) 5 MG 24 hr tablet Take 5 mg by mouth daily.  11  . meloxicam (MOBIC) 15 MG tablet Take 15 mg by mouth daily.    . metoprolol succinate (TOPROL-XL) 25 MG 24 hr tablet Take 0.5 tablets (12.5 mg total) by mouth daily. 15 tablet 10  . omeprazole (PRILOSEC) 40 MG capsule Take 40 mg by mouth daily.    . pravastatin (PRAVACHOL) 40 MG tablet Take 40 mg daily by mouth.  11  . zolmitriptan (ZOMIG) 5 MG tablet Take 5 mg by mouth daily as needed for migraine.      No current facility-administered medications on file prior to visit.     ALLERGIES: Allergies  Allergen Reactions  . Codeine Nausea Only    FAMILY HISTORY: Family History  Problem Relation Age of Onset  . Dementia Mother   . Breast cancer Mother   . Alzheimer's disease Mother   . Arrhythmia Father   . Neuropathy Father   . Diabetes Brother     SOCIAL HISTORY: Social History   Socioeconomic History  . Marital status: Married    Spouse name: Not on file  . Number of children: Not on file  . Years of education: Not on file  . Highest education level: Not on file  Occupational History  . Not on file  Social Needs  . Financial resource strain: Not on file    . Food insecurity:    Worry: Not on file    Inability: Not on file  . Transportation needs:    Medical: Not on file    Non-medical: Not on file  Tobacco Use  . Smoking status: Former Smoker    Packs/day: 0.25    Types: Cigarettes    Last attempt to quit: 09/27/1992    Years since quitting: 25.3  . Smokeless tobacco: Never Used  Substance and Sexual Activity  . Alcohol use: Yes    Comment: rare  . Drug use: No  . Sexual activity: Not on file  Lifestyle  . Physical activity:    Days per week: Not on file    Minutes per session: Not on file  . Stress: Not on file  Relationships  . Social connections:    Talks on phone: Not on file    Gets together: Not on file    Attends religious service: Not on file    Active member of club or organization: Not on file    Attends meetings of clubs or organizations: Not on file    Relationship status: Not on file  . Intimate partner violence:    Fear of current or ex partner: Not on file    Emotionally abused: Not on file    Physically abused: Not on file    Forced sexual activity: Not on file  Other Topics Concern  . Not on file  Social History Narrative  . Not on file    REVIEW OF SYSTEMS: Constitutional: No fevers, chills, or sweats, no generalized fatigue, change in appetite Eyes: No visual changes, double vision, eye pain Ear, nose and throat: No hearing loss, ear pain, nasal congestion, sore throat Cardiovascular: No chest pain, palpitations Respiratory:  No shortness of breath at rest or with exertion, wheezes GastrointestinaI: No nausea, vomiting, diarrhea, abdominal pain, fecal incontinence Genitourinary:  No dysuria, urinary retention or frequency Musculoskeletal:  +neck pain, back pain Integumentary: No rash, pruritus, skin lesions Neurological: as above Psychiatric: + depression, anxiety Endocrine: No palpitations, fatigue, diaphoresis, mood swings, change in appetite, change in weight, increased  thirst Hematologic/Lymphatic:  No anemia, purpura, petechiae. Allergic/Immunologic: no itchy/runny eyes, nasal congestion, recent allergic reactions, rashes  PHYSICAL EXAM: Vitals:   02/08/18 1131  BP: 136/88  Pulse: 83  SpO2: 98%   General: No acute distress Head:  Normocephalic/atraumatic Neck: supple, no paraspinal tenderness, full range of motion Heart:  Regular rate and rhythm Lungs:  Clear to auscultation bilaterally Back: No paraspinal tenderness Skin/Extremities: No rash, no edema Neurological Exam: alert and oriented to person, place, and time. No aphasia or dysarthria. Fund of knowledge is appropriate.  Recent and remote memory are intact.  Attention and concentration are normal.    Able to name objects and repeat phrases. CDT 5/5 MMSE - Mini Mental State Exam 02/08/2018 08/04/2017  Orientation to time 5 5  Orientation to Place 5 5  Registration 3 3  Attention/ Calculation 5 5  Recall 3 2  Language- name 2 objects 2 2  Language- repeat 1 1  Language- follow 3 step command 3 3  Language- read & follow direction 1 1  Write a sentence 1 1  Copy design 1 1  Total score 30 29   Cranial nerves: Pupils equal, round, reactive to light.  Extraocular movements intact with no nystagmus. Visual fields full. Facial sensation intact. No facial asymmetry. Tongue, uvula, palate midline.  Motor: Bulk and tone normal, no cogwheeling, muscle strength 5/5 throughout with no pronator drift.  Sensation to light touch intact.  No extinction to double simultaneous stimulation.  Finger to nose testing intact.  Gait slow and cautious due to knee pain, no ataxia. No resting tremor, mild postural and endpoint tremor.  IMPRESSION: This is a 67 yo RH woman with a history of  atrial fibrillation, migraines, fibromyalgia, chronic fatigue syndrome, strong family history of dementia, who presented with worsening memory and neuropathy. She and her husband report memory has been stable, however she does  notice more cognitive issues with worsened pain. Her MMSE today is normal 30/30, we discussed that there is no evidence of dementia with history or exam. We again discussed how pain, stress, mood issues can affect memory, if these are better controlled and memory continues to  be an issue, we will schedule Neurocognitive testing. Continue management of pain and neuropathy with PCP and Ortho. She has noticed more tremors, no signs of Parkinson's disease, possibly related to anxiety or benign essential tremor. She will follow-up in 6 months and knows to call for any changes  Thank you for allowing me to participate in jer care.  Please do not hesitate to call for any questions or concerns.  The duration of this appointment visit was 30 minutes of face-to-face time with the patient.  Greater than 50% of this time was spent in counseling, explanation of diagnosis, planning of further management, and coordination of care.   Patrcia Dolly, M.D.   CC: Dr. Hyman Ferguson

## 2018-02-08 NOTE — Patient Instructions (Signed)
1. Continue follow-up with PCP for neuropathy, anxiety 2. Continue with Pain Management 3. Follow-up in 6 months, call for any changes   RECOMMENDATIONS FOR ALL PATIENTS WITH MEMORY PROBLEMS: 1. Continue to exercise (Recommend 30 minutes of walking everyday, or 3 hours every week) 2. Increase social interactions - continue going to [place] and enjoy social gatherings with friends and family 3. Eat healthy, avoid fried foods and eat more fruits and vegetables 4. Maintain adequate blood pressure, blood sugar, and blood cholesterol level. Reducing the risk of stroke and cardiovascular disease also helps promoting better memory. 5. Avoid stressful situations. Live a simple life and avoid aggravations. Organize your time and prepare for the next day in anticipation. 6. Sleep well, avoid any interruptions of sleep and avoid any distractions in the bedroom that may interfere with adequate sleep quality 7. Avoid sugar, avoid sweets as there is a strong link between excessive sugar intake, diabetes, and cognitive impairment The Mediterranean diet has been shown to help patients reduce the risk of progressive memory disorders and reduces cardiovascular risk. This includes eating fish, eat fruits and green leafy vegetables, nuts like almonds and hazelnuts, walnuts, and also use olive oil. Avoid fast foods and fried foods as much as possible. Avoid sweets and sugar as sugar use has been linked to worsening of memory function.

## 2018-02-09 DIAGNOSIS — M1711 Unilateral primary osteoarthritis, right knee: Secondary | ICD-10-CM | POA: Diagnosis not present

## 2018-02-16 DIAGNOSIS — M1711 Unilateral primary osteoarthritis, right knee: Secondary | ICD-10-CM | POA: Diagnosis not present

## 2018-02-28 DIAGNOSIS — E119 Type 2 diabetes mellitus without complications: Secondary | ICD-10-CM | POA: Diagnosis not present

## 2018-02-28 DIAGNOSIS — Z7984 Long term (current) use of oral hypoglycemic drugs: Secondary | ICD-10-CM | POA: Diagnosis not present

## 2018-04-06 DIAGNOSIS — M1711 Unilateral primary osteoarthritis, right knee: Secondary | ICD-10-CM | POA: Diagnosis not present

## 2018-04-26 ENCOUNTER — Other Ambulatory Visit: Payer: Self-pay | Admitting: Cardiology

## 2018-05-18 ENCOUNTER — Other Ambulatory Visit: Payer: Self-pay | Admitting: Cardiology

## 2018-05-26 ENCOUNTER — Encounter: Payer: Self-pay | Admitting: Physician Assistant

## 2018-05-30 ENCOUNTER — Ambulatory Visit: Payer: PPO | Admitting: Physician Assistant

## 2018-05-30 ENCOUNTER — Encounter: Payer: Self-pay | Admitting: Physician Assistant

## 2018-05-30 VITALS — BP 144/86 | HR 87 | Ht 69.5 in | Wt 219.0 lb

## 2018-05-30 DIAGNOSIS — E119 Type 2 diabetes mellitus without complications: Secondary | ICD-10-CM

## 2018-05-30 DIAGNOSIS — E785 Hyperlipidemia, unspecified: Secondary | ICD-10-CM | POA: Insufficient documentation

## 2018-05-30 DIAGNOSIS — I480 Paroxysmal atrial fibrillation: Secondary | ICD-10-CM | POA: Diagnosis not present

## 2018-05-30 DIAGNOSIS — E782 Mixed hyperlipidemia: Secondary | ICD-10-CM | POA: Diagnosis not present

## 2018-05-30 NOTE — Progress Notes (Signed)
Cardiology Office Note    Date:  05/30/2018   ID:  Kianah, Harries 1951/05/08, MRN [id number]  PCP:  Shirlean Mylar, MD  Cardiologist: Armanda Magic, MD  No chief complaint on file.   History of Present Illness:  Sydney Ferguson is a 67 y.o. female with history of PAF status post failed ablation 2002 on flecainide, Chadsvasc=3 for female, age and DM.  I saw the patient 04/2017 at which time restarted on Xarelto 20 mg daily because she turned 66.  History of GERD, chronic dyspnea on exertion secondary to weight gain with normal stress test and echo in 2014    Patient comes in today for yearly follow-up.She never started Xarelto. Doesn't want the easy bruising.  Having chronic knee problems and not able to exercise as much as she is like.  Denies any chest pain.  Her last breakthrough of atrial fibrillation was probably 2 years ago.  Past Medical History:  Diagnosis Date  . Atrial tachycardia (HCC)   . CFIDS (chronic fatigue and immune dysfunction syndrome) (HCC)   . Diabetes mellitus without complication (HCC)   . Endometriosis   . Fibromyalgia   . Hypertension   . Migraines   . PAF (paroxysmal atrial fibrillation) (HCC)    s/p failed ablation 2002    Past Surgical History:  Procedure Laterality Date  . ABLATION  2002   unsucessful  . BREAST BIOPSY Bilateral   . BREAST LUMPECTOMY    . PARTIAL HYSTERECTOMY      Current Medications: Current Meds  Medication Sig  . ALPRAZolam (XANAX) 0.5 MG tablet Take 0.5 mg by mouth daily as needed for anxiety.   Marland Kitchen BIOTIN 5000 PO Take 1 capsule by mouth daily.  . cetirizine (ZYRTEC) 10 MG tablet Take 10 mg by mouth daily as needed for allergies.  . ciclesonide (OMNARIS) 50 MCG/ACT nasal spray Place 2 sprays into both nostrils daily as needed for allergies.   . clonazePAM (KLONOPIN) 1 MG tablet Take 1 mg by mouth daily.  . cyclobenzaprine (FLEXERIL) 10 MG tablet TAKE 1 TABLET BY MOUTH AT BEDTIME  . DULoxetine (CYMBALTA) 60 MG  capsule TAKE 1 CAPSULE ONCE A DAY  . flecainide (TAMBOCOR) 100 MG tablet Take 1 tablet (100 mg total) by mouth 2 (two) times daily. Please keep upcoming appt in September for future refills. Thank you  . gabapentin (NEURONTIN) 100 MG capsule Take 100 mg by mouth 3 (three) times daily. Take 300mg  by mouth at night  . glipiZIDE (GLUCOTROL) 5 MG tablet Take by mouth 2 (two) times daily before a meal.  . meloxicam (MOBIC) 15 MG tablet Take 15 mg by mouth daily as needed for pain.  . metoprolol succinate (TOPROL-XL) 25 MG 24 hr tablet Take 0.5 tablets (12.5 mg total) by mouth daily. Please keep upcoming appt in September for future refills. Thank you  . omeprazole (PRILOSEC) 40 MG capsule Take 40 mg by mouth daily.  . pravastatin (PRAVACHOL) 40 MG tablet Take 40 mg daily by mouth.  . zolmitriptan (ZOMIG) 5 MG tablet Take 5 mg by mouth daily as needed for migraine.      Allergies:   Codeine   Social History   Socioeconomic History  . Marital status: Married    Spouse name: Not on file  . Number of children: Not on file  . Years of education: Not on file  . Highest education level: Not on file  Occupational History  . Not on file  Social Needs  .  Financial resource strain: Not on file  . Food insecurity:    Worry: Not on file    Inability: Not on file  . Transportation needs:    Medical: Not on file    Non-medical: Not on file  Tobacco Use  . Smoking status: Former Smoker    Packs/day: 0.25    Types: Cigarettes    Last attempt to quit: 09/27/1992    Years since quitting: 25.6  . Smokeless tobacco: Never Used  Substance and Sexual Activity  . Alcohol use: Yes    Comment: rare  . Drug use: No  . Sexual activity: Not on file  Lifestyle  . Physical activity:    Days per week: Not on file    Minutes per session: Not on file  . Stress: Not on file  Relationships  . Social connections:    Talks on phone: Not on file    Gets together: Not on file    Attends religious service: Not  on file    Active member of club or organization: Not on file    Attends meetings of clubs or organizations: Not on file    Relationship status: Not on file  Other Topics Concern  . Not on file  Social History Narrative  . Not on file     Family History:  The patient's family history includes Alzheimer's disease in her mother; Arrhythmia in her father; Breast cancer in her mother; Dementia in her mother; Diabetes in her brother; Neuropathy in her father.   ROS:   Please see the history of present illness.    Review of Systems  Constitution: Negative.  HENT: Negative.   Eyes: Negative.   Cardiovascular: Positive for palpitations.  Respiratory: Negative.   Hematologic/Lymphatic: Negative.   Musculoskeletal: Positive for arthritis, joint pain and joint swelling.  Gastrointestinal: Negative.   Genitourinary: Negative.   Neurological: Negative.    All other systems reviewed and are negative.   PHYSICAL EXAM:   VS:  BP (!) 144/86   Pulse 87   Ht 5' 9.5" (1.765 m)   Wt 219 lb (99.3 kg)   BMI 31.88 kg/m   Physical Exam  GEN: Well nourished, well developed, in no acute distress  Neck: no JVD, carotid bruits, or masses Cardiac:RRR; positive S4, 1/6 systolic murmur the left sternal border Respiratory:  clear to auscultation bilaterally, normal work of breathing GI: soft, nontender, nondistended, + BS Ext: without cyanosis, clubbing, or edema, Good distal pulses bilaterally Neuro:  Alert and Oriented x 3, Strength and sensation are intact Psych: euthymic mood, full affect  Wt Readings from Last 3 Encounters:  05/30/18 219 lb (99.3 kg)  02/08/18 218 lb (98.9 kg)  08/04/17 217 lb (98.4 kg)      Studies/Labs Reviewed:   EKG:  EKG is  ordered today.  The ekg ordered today demonstrates  NSR IRBBB  Recent Labs: 08/04/2017: TSH 1.55   Lipid Panel No results found for: CHOL, TRIG, HDL, CHOLHDL, VLDL, LDLCALC, LDLDIRECT  Additional studies/ records that were reviewed today  include:  2D echo 2014 Study Conclusions  - Left ventricle: The cavity size was normal. There was mild   focal basal hypertrophy of the septum. Systolic function   was vigorous. The estimated ejection fraction was in the   range of 65% to 70%. Wall motion was normal; there were no   regional wall motion abnormalities. Doppler parameters are   consistent with abnormal left ventricular relaxation   (grade 1 diastolic dysfunction). -  Left atrium: The atrium was normal in size.   Anterior-posterior dimension: 32mm (2D).    ASSESSMENT:    1. PAF (paroxysmal atrial fibrillation) (HCC)   2. Controlled type 2 diabetes mellitus without complication, without long-term current use of insulin (HCC)      PLAN:  In order of problems listed above:  PAF on flecainide and Toprol.  No breakthrough for 2 years.  CHA2DS2-VASc equals 3.  Dr. Mayford Knife recommended Xarelto 20 mg daily but patient chose not to take this.  Had a long discussion about the risks of stroke with patient.  She says she will discuss with Dr. Hyman Hopes and call us if she decides to take it.  To have surveillance labs by see PCP in the next 2 weeks.  I have asked her to send a copy.  DM On Glucotrol  Hyperlipidemia on Pravachol.  Labs to be checked by PCP.   Medication Adjustments/Labs and Tests Ordered: Current medicines are reviewed at length with the patient today.  Concerns regarding medicines are outlined above.  Medication changes, Labs and Tests ordered today are listed in the Patient Instructions below. There are no Patient Instructions on file for this visit.   Signed, Jacolyn Reedy, PA-C  05/30/2018 1:42 PM    [facility] [address] [place], [place], [place]  [address] Phone: [phone]; Fax: [phone]

## 2018-05-30 NOTE — Patient Instructions (Signed)
Medication Instructions:  Your physician recommends that you continue on your current medications as directed. Please refer to the Current Medication list given to you today.  Please call the office if you would like to restart your Xarelto  Labwork: Please have your primary care doctor fax over a copy of your lab results when you have them done  Testing/Procedures: None ordered  Follow-Up: Your physician wants you to follow-up in: 1 year with Dr. Mayford Knife. You will receive a reminder letter in the mail two months in advance. If you don't receive a letter, please call our office to schedule the follow-up appointment.   Any Other Special Instructions Will Be Listed Below (If Applicable).     If you need a refill on your cardiac medications before your next appointment, please call your pharmacy.

## 2018-06-05 DIAGNOSIS — Z7984 Long term (current) use of oral hypoglycemic drugs: Secondary | ICD-10-CM | POA: Diagnosis not present

## 2018-06-05 DIAGNOSIS — E119 Type 2 diabetes mellitus without complications: Secondary | ICD-10-CM | POA: Diagnosis not present

## 2018-06-18 ENCOUNTER — Other Ambulatory Visit: Payer: Self-pay | Admitting: Cardiology

## 2018-07-05 DIAGNOSIS — M62838 Other muscle spasm: Secondary | ICD-10-CM | POA: Diagnosis not present

## 2018-07-05 DIAGNOSIS — M26609 Unspecified temporomandibular joint disorder, unspecified side: Secondary | ICD-10-CM | POA: Diagnosis not present

## 2018-07-05 DIAGNOSIS — Z23 Encounter for immunization: Secondary | ICD-10-CM | POA: Diagnosis not present

## 2018-08-30 ENCOUNTER — Encounter: Payer: Self-pay | Admitting: Neurology

## 2018-08-30 ENCOUNTER — Ambulatory Visit (INDEPENDENT_AMBULATORY_CARE_PROVIDER_SITE_OTHER): Payer: PPO | Admitting: Neurology

## 2018-08-30 ENCOUNTER — Other Ambulatory Visit: Payer: Self-pay

## 2018-08-30 VITALS — BP 144/90 | HR 93 | Ht 69.5 in | Wt 219.0 lb

## 2018-08-30 DIAGNOSIS — R400 Somnolence: Secondary | ICD-10-CM | POA: Diagnosis not present

## 2018-08-30 DIAGNOSIS — R413 Other amnesia: Secondary | ICD-10-CM

## 2018-08-30 NOTE — Patient Instructions (Signed)
1. Schedule home sleep study 2. Continue working with your doctor on pain control 3. If mood becomes more of an issue, let us know and we can send referral to a therapist 4. Follow-up in 6 months, call for any changes   RECOMMENDATIONS FOR ALL PATIENTS WITH MEMORY PROBLEMS: 1. Continue to exercise (Recommend 30 minutes of walking everyday, or 3 hours every week) 2. Increase social interactions - continue going to [place] and enjoy social gatherings with friends and family 3. Eat healthy, avoid fried foods and eat more fruits and vegetables 4. Maintain adequate blood pressure, blood sugar, and blood cholesterol level. Reducing the risk of stroke and cardiovascular disease also helps promoting better memory. 5. Avoid stressful situations. Live a simple life and avoid aggravations. Organize your time and prepare for the next day in anticipation. 6. Sleep well, avoid any interruptions of sleep and avoid any distractions in the bedroom that may interfere with adequate sleep quality 7. Avoid sugar, avoid sweets as there is a strong link between excessive sugar intake, diabetes, and cognitive impairment We discussed the Mediterranean diet, which has been shown to help patients reduce the risk of progressive memory disorders and reduces cardiovascular risk. This includes eating fish, eat fruits and green leafy vegetables, nuts like almonds and hazelnuts, walnuts, and also use olive oil. Avoid fast foods and fried foods as much as possible. Avoid sweets and sugar as sugar use has been linked to worsening of memory function.

## 2018-08-30 NOTE — Progress Notes (Signed)
NEUROLOGY FOLLOW UP OFFICE NOTE  Sydney Ferguson [id number] 1950-12-26  HISTORY OF PRESENT ILLNESS: I had the pleasure of seeing Sydney Ferguson in follow-up in the neurology clinic on 08/30/2018.  The patient was last seen 7 months ago for worsening memory and word-finding difficulties. She is again accompanied by her husband who helps supplement the history today.  TSH normal, B12 low normal 245. MMSE 30/30 in May 2019. We had previously discussed how stress and pain can contribute to subjective cognitive complaints. She reports today is an "off day." Her husband feels memory is unchanged. She has noticed maybe a little worsening, she would catch herself putting something for the fridge inside the cabinet. She continues to drive without getting lost. She is overall good with medications, every now and then she forgets. Her husband manages finances. She continues to deal with a lot of neuropathic pain in both feet, taking gabapentin 300mg  BID without side effects. Sometimes at night she can barely stand the pain. After walking on cement floors all day, she would feel bad the next day. She thinks she has more depression due to the pain. She is not sleeping well, she takes naps in the afternoon feeling so tired. Her husband reports snoring but has not noticed apneic episodes. No significant personality changes, no paranoia or hallucinations. She denies any headaches, dizziness, vision changes. She has tripped a few times without injuries.   HPI 08/04/2017: This is a 67 yo RH woman with a history of atrial fibrillation, migraines, fibromyalgia, chronic fatigue syndrome, presenting for evaluation of worsening memory and neuropathy. She is very concerned due to strong family history of Alzheimer's disease in her family. Her mother and 6 maternal aunts had dementia. She started noticing word-finding difficulties around a year ago, not being able to find the right words, saying "cow" when thinking of "dog."  Around 2 months ago, she put water for coffee in a different spot, which is similar to what her mother had done in the past, concerning her. Around 2 weeks ago, she got lost driving to her sister's house. She denies getting lost driving. She is pretty good with managing medications. Her husband is in charge of finances. She loses things a lot, forgetting what she was looking for. They endorse high stress over the past year taking care of her mother, with her parents passing away within a short span of each other. Her husband started noticing changes around a year ago, mostly with talking then hesitating looking for a word. He would end up finishing her sentences. No personality changes or hallucinations. No difficulties with ADLs. She denies any history of concussions. She has had a couple of drinks every night for the past 30 years.   She started having numbness, tingling, and sharp pain in both feet around 2-3 years ago. She saw Dr. Foy Guadalajara who felt the symptoms were related to her fibromyalgia. She saw her new PCP Dr. Hyman Hopes and was found to have diabetes. Her last HbA1c was 7 (improved from initial diagnosis). She reports paresthesias are constant, gabapentin 300mg  qhs helps. She is also on Cymbalta for the fibromyalgia, she cannot walk if she misses a dose. Symptoms affect her foot up to the ankles. Hands are not affected. She denies any falls. She has rare migraines. She has some dizziness when standing. She gets choked a lot when swallowing. She has a lot of neck pain, some back pain. No diplopia, dysarthria, bowel/bladder dysfunction, anosmia, or tremors.   PAST MEDICAL HISTORY:  Past Medical History:  Diagnosis Date  . Atrial tachycardia (HCC)   . CFIDS (chronic fatigue and immune dysfunction syndrome) (HCC)   . Diabetes mellitus without complication (HCC)   . Endometriosis   . Fibromyalgia   . Hypertension   . Migraines   . PAF (paroxysmal atrial fibrillation) (HCC)    s/p failed ablation 2002      MEDICATIONS: Current Outpatient Medications on File Prior to Visit  Medication Sig Dispense Refill  . ALPRAZolam (XANAX) 0.5 MG tablet Take 0.5 mg by mouth daily as needed for anxiety.   1  . BIOTIN 5000 PO Take 1 capsule by mouth daily.    . cetirizine (ZYRTEC) 10 MG tablet Take 10 mg by mouth daily as needed for allergies.    . ciclesonide (OMNARIS) 50 MCG/ACT nasal spray Place 2 sprays into both nostrils daily as needed for allergies.     . clonazePAM (KLONOPIN) 1 MG tablet Take 1 mg by mouth daily.    . cyclobenzaprine (FLEXERIL) 10 MG tablet TAKE 1 TABLET BY MOUTH AT BEDTIME  0  . DULoxetine (CYMBALTA) 60 MG capsule TAKE 1 CAPSULE ONCE A DAY  3  . flecainide (TAMBOCOR) 100 MG tablet Take 1 tablet (100 mg total) by mouth 2 (two) times daily. 60 tablet 11  . gabapentin (NEURONTIN) 100 MG capsule Take 100 mg by mouth 3 (three) times daily. Take 300mg  by mouth at night  11  . glipiZIDE (GLUCOTROL) 5 MG tablet Take by mouth 2 (two) times daily before a meal.    . meloxicam (MOBIC) 15 MG tablet Take 15 mg by mouth daily as needed for pain.    . metoprolol succinate (TOPROL-XL) 25 MG 24 hr tablet Take 0.5 tablets (12.5 mg total) by mouth daily. 45 tablet 3  . omeprazole (PRILOSEC) 40 MG capsule Take 40 mg by mouth daily.    . pravastatin (PRAVACHOL) 40 MG tablet Take 40 mg daily by mouth.  11  . zolmitriptan (ZOMIG) 5 MG tablet Take 5 mg by mouth daily as needed for migraine.      No current facility-administered medications on file prior to visit.     ALLERGIES: Allergies  Allergen Reactions  . Codeine Nausea Only    FAMILY HISTORY: Family History  Problem Relation Age of Onset  . Dementia Mother   . Breast cancer Mother   . Alzheimer's disease Mother   . Arrhythmia Father   . Neuropathy Father   . Diabetes Brother     SOCIAL HISTORY: Social History   Socioeconomic History  . Marital status: Married    Spouse name: Not on file  . Number of children: Not on file  .  Years of education: Not on file  . Highest education level: Not on file  Occupational History  . Not on file  Social Needs  . Financial resource strain: Not on file  . Food insecurity:    Worry: Not on file    Inability: Not on file  . Transportation needs:    Medical: Not on file    Non-medical: Not on file  Tobacco Use  . Smoking status: Former Smoker    Packs/day: 0.25    Types: Cigarettes    Last attempt to quit: 09/27/1992    Years since quitting: 25.9  . Smokeless tobacco: Never Used  Substance and Sexual Activity  . Alcohol use: Yes    Comment: rare  . Drug use: No  . Sexual activity: Not on file  Lifestyle  .  Physical activity:    Days per week: Not on file    Minutes per session: Not on file  . Stress: Not on file  Relationships  . Social connections:    Talks on phone: Not on file    Gets together: Not on file    Attends religious service: Not on file    Active member of club or organization: Not on file    Attends meetings of clubs or organizations: Not on file    Relationship status: Not on file  . Intimate partner violence:    Fear of current or ex partner: Not on file    Emotionally abused: Not on file    Physically abused: Not on file    Forced sexual activity: Not on file  Other Topics Concern  . Not on file  Social History Narrative  . Not on file    REVIEW OF SYSTEMS: Constitutional: No fevers, chills, or sweats, no generalized fatigue, change in appetite Eyes: No visual changes, double vision, eye pain Ear, nose and throat: No hearing loss, ear pain, nasal congestion, sore throat Cardiovascular: No chest pain, palpitations Respiratory:  No shortness of breath at rest or with exertion, wheezes GastrointestinaI: No nausea, vomiting, diarrhea, abdominal pain, fecal incontinence Genitourinary:  No dysuria, urinary retention or frequency Musculoskeletal:  +neck pain, back pain Integumentary: No rash, pruritus, skin lesions Neurological: as  above Psychiatric: + depression, anxiety Endocrine: No palpitations, fatigue, diaphoresis, mood swings, change in appetite, change in weight, increased thirst Hematologic/Lymphatic:  No anemia, purpura, petechiae. Allergic/Immunologic: no itchy/runny eyes, nasal congestion, recent allergic reactions, rashes  PHYSICAL EXAM: Vitals:   08/30/18 1132  BP: (!) 144/90  Pulse: 93  SpO2: 96%   General: No acute distress Head:  Normocephalic/atraumatic Neck: supple, no paraspinal tenderness, full range of motion Heart:  Regular rate and rhythm Lungs:  Clear to auscultation bilaterally Back: No paraspinal tenderness Skin/Extremities: No rash, no edema Neurological Exam: alert and oriented to person, place, and time. No aphasia or dysarthria. Fund of knowledge is appropriate.  Recent and remote memory are intact.  Attention and concentration are normal.    Able to name objects and repeat phrases. CDT 4/5 MMSE - Mini Mental State Exam 08/30/2018 02/08/2018 08/04/2017  Orientation to time 5 5 5   Orientation to Place 5 5 5   Registration 3 3 3   Attention/ Calculation 5 5 5   Recall 3 3 2   Language- name 2 objects 2 2 2   Language- repeat 1 1 1   Language- follow 3 step command 3 3 3   Language- read & follow direction 1 1 1   Write a sentence 1 1 1   Copy design 1 1 1   Total score 30 30 29    Cranial nerves: Pupils equal, round, reactive to light.  Extraocular movements intact with no nystagmus. Visual fields full. Facial sensation intact. No facial asymmetry. Tongue, uvula, palate midline.  Motor: Bulk and tone normal, no cogwheeling, muscle strength 5/5 throughout with no pronator drift.  Sensation to light touch intact.  No extinction to double simultaneous stimulation.  Finger to nose testing intact.  Gait slow and cautious due to knee pain, no ataxia. No resting tremor, mild postural tremor L>R, no endpoint tremor today.   IMPRESSION: This is a 67 yo RH woman with a history of  atrial fibrillation,  migraines, fibromyalgia, chronic fatigue syndrome, strong family history of dementia, who presented with worsening memory and neuropathy. Memory is overall stable, MMSE normal again today 30/30. Non-focal neurological exam. There  has been no significant decline by history and on testing. We discussed there is no evidence of dementia/neurodegenerative process at this time. We again discussed different causes of memory changes, including depression, stress, pain. Continue with better pain control, she knows to call our office if mood changes worsen and we can send a referral for psychotherapy. We also discussed how sleep apnea can contribute to cognitive issues, her husband reports snoring, she has daytime drowsiness and fatigue, home sleep study will be ordered. We again discussed the importance of control of vascular risk factors, physical exercise, and brain stimulation exercises for brain health. She will follow-up in 6 months and knows to call for any changes  Thank you for allowing me to participate in jer care.  Please do not hesitate to call for any questions or concerns.  The duration of this appointment visit was 30 minutes of face-to-face time with the patient.  Greater than 50% of this time was spent in counseling, explanation of diagnosis, planning of further management, and coordination of care.   Patrcia Dolly, M.D.   CC: Dr. Hyman Hopes

## 2018-09-04 ENCOUNTER — Other Ambulatory Visit: Payer: Self-pay | Admitting: Family Medicine

## 2018-09-04 DIAGNOSIS — Z1231 Encounter for screening mammogram for malignant neoplasm of breast: Secondary | ICD-10-CM

## 2018-09-25 NOTE — Progress Notes (Signed)
Received notice from [employer] that pt's HOME SLEEP TEST has been   Approved 09/27/2018 thru 12/26/2018 Authorization # [id number]

## 2018-10-13 ENCOUNTER — Ambulatory Visit
Admission: RE | Admit: 2018-10-13 | Discharge: 2018-10-13 | Disposition: A | Discharge status: Home / Self Care | Payer: PPO | Source: Ambulatory Visit | Attending: Family Medicine | Admitting: Family Medicine

## 2018-10-13 DIAGNOSIS — Z1231 Encounter for screening mammogram for malignant neoplasm of breast: Secondary | ICD-10-CM

## 2018-10-24 ENCOUNTER — Ambulatory Visit (HOSPITAL_BASED_OUTPATIENT_CLINIC_OR_DEPARTMENT_OTHER): Payer: PPO | Attending: Neurology | Admitting: Internal Medicine

## 2018-10-24 VITALS — Ht 69.0 in | Wt 216.0 lb

## 2018-10-24 DIAGNOSIS — R400 Somnolence: Secondary | ICD-10-CM

## 2018-10-24 DIAGNOSIS — R413 Other amnesia: Secondary | ICD-10-CM | POA: Insufficient documentation

## 2018-10-24 DIAGNOSIS — G4733 Obstructive sleep apnea (adult) (pediatric): Secondary | ICD-10-CM | POA: Diagnosis not present

## 2018-10-28 DIAGNOSIS — G4733 Obstructive sleep apnea (adult) (pediatric): Secondary | ICD-10-CM | POA: Diagnosis not present

## 2018-10-28 NOTE — Procedures (Signed)
   Patient Name: Sydney Ferguson, Sydney Ferguson Date: 10/24/2018 Gender: Female D.O.B: [birth year] Age (years): 67 Referring Provider: Van Clines Height (inches): 69 Interpreting Physician: Jetty Duhamel MD, ABSM Weight (lbs): 219 RPSGT: Celene Kras BMI: 32 MRN: [id number] Neck Size: 15.50  CLINICAL INFORMATION Sleep Study Type: HST Indication for sleep study: OSA Epworth Sleepiness Score: 6  SLEEP STUDY TECHNIQUE A multi-channel overnight portable sleep study was performed. The channels recorded were: nasal airflow, thoracic respiratory movement, and oxygen saturation with a pulse oximetry. Snoring was also monitored.  MEDICATIONS Patient self administered medications include: none reeported.  SLEEP ARCHITECTURE Patient was studied for 367.5 minutes. The sleep efficiency was 99.1 % and the patient was supine for 0%. The arousal index was 0.0 per hour.  RESPIRATORY PARAMETERS The overall AHI was 12.1 per hour, with a central apnea index of 0.0 per hour. The oxygen nadir was 84% during sleep.  CARDIAC DATA Mean heart rate during sleep was 83.1 bpm.  IMPRESSIONS - Mild obstructive sleep apnea occurred during this study (AHI = 12.1/h). - No significant central sleep apnea occurred during this study (CAI = 0.0/h). - Moderate oxygen desaturation was noted during this study (Min O2 = 84%). Mean sat 91%.  - Time with O2 sat 89% or less was 25.1 minutes. - Patient snored.  DIAGNOSIS - Obstructive Sleep Apnea (327.23 [G47.33 ICD-10]) - Nocturnal Hypoxemia (327.26 [G47.36 ICD-10])  RECOMMENDATIONS - Treatment for mild OSA is directed by symptoms, with consideration of co-morbidities. Conservative measures may include observation, weight loss and sleep position off back. - Suggest CPAP titration sleep study. This will determine if correction of OSA alone is sufficient to correct the nocturnal hypoxemia, otherwise supplemental O2 might be appropriate. - Be careful with alcohol,  sedatives and other CNS depressants that may worsen sleep apnea and disrupt normal sleep architecture. - Sleep hygiene should be reviewed to assess factors that may improve sleep quality. - Weight management and regular exercise should be initiated or continued.  [Electronically signed] 10/28/2018 10:15 AM  Jetty Duhamel MD, ABSM Diplomate, American Board of Sleep Medicine   NPI: [id number]                          Jetty Duhamel Diplomate, American Board of Sleep Medicine  ELECTRONICALLY SIGNED ON:  10/28/2018, 10:07 AM [facility] PH: [phone]   FX: [phone] ACCREDITED BY THE AMERICAN ACADEMY OF SLEEP MEDICINE

## 2019-01-25 DIAGNOSIS — E782 Mixed hyperlipidemia: Secondary | ICD-10-CM | POA: Diagnosis not present

## 2019-01-25 DIAGNOSIS — Z0000 Encounter for general adult medical examination without abnormal findings: Secondary | ICD-10-CM | POA: Diagnosis not present

## 2019-01-25 DIAGNOSIS — E1169 Type 2 diabetes mellitus with other specified complication: Secondary | ICD-10-CM | POA: Diagnosis not present

## 2019-01-29 DIAGNOSIS — R5382 Chronic fatigue, unspecified: Secondary | ICD-10-CM | POA: Diagnosis not present

## 2019-01-29 DIAGNOSIS — I480 Paroxysmal atrial fibrillation: Secondary | ICD-10-CM | POA: Diagnosis not present

## 2019-01-29 DIAGNOSIS — E119 Type 2 diabetes mellitus without complications: Secondary | ICD-10-CM | POA: Diagnosis not present

## 2019-01-29 DIAGNOSIS — I471 Supraventricular tachycardia: Secondary | ICD-10-CM | POA: Diagnosis not present

## 2019-01-29 DIAGNOSIS — E1169 Type 2 diabetes mellitus with other specified complication: Secondary | ICD-10-CM | POA: Diagnosis not present

## 2019-01-29 DIAGNOSIS — G43709 Chronic migraine without aura, not intractable, without status migrainosus: Secondary | ICD-10-CM | POA: Diagnosis not present

## 2019-01-29 DIAGNOSIS — F5101 Primary insomnia: Secondary | ICD-10-CM | POA: Diagnosis not present

## 2019-01-29 DIAGNOSIS — N3010 Interstitial cystitis (chronic) without hematuria: Secondary | ICD-10-CM | POA: Diagnosis not present

## 2019-01-29 DIAGNOSIS — Z0000 Encounter for general adult medical examination without abnormal findings: Secondary | ICD-10-CM | POA: Diagnosis not present

## 2019-01-29 DIAGNOSIS — M797 Fibromyalgia: Secondary | ICD-10-CM | POA: Diagnosis not present

## 2019-01-29 DIAGNOSIS — E782 Mixed hyperlipidemia: Secondary | ICD-10-CM | POA: Diagnosis not present

## 2019-02-09 DIAGNOSIS — Z0000 Encounter for general adult medical examination without abnormal findings: Secondary | ICD-10-CM | POA: Diagnosis not present

## 2019-02-09 DIAGNOSIS — Z23 Encounter for immunization: Secondary | ICD-10-CM | POA: Diagnosis not present

## 2019-02-09 DIAGNOSIS — E1169 Type 2 diabetes mellitus with other specified complication: Secondary | ICD-10-CM | POA: Diagnosis not present

## 2019-02-09 DIAGNOSIS — Z1211 Encounter for screening for malignant neoplasm of colon: Secondary | ICD-10-CM | POA: Diagnosis not present

## 2019-04-10 ENCOUNTER — Encounter: Payer: Self-pay | Admitting: Neurology

## 2019-04-10 ENCOUNTER — Telehealth (INDEPENDENT_AMBULATORY_CARE_PROVIDER_SITE_OTHER): Payer: PPO | Admitting: Neurology

## 2019-04-10 ENCOUNTER — Other Ambulatory Visit: Payer: Self-pay

## 2019-04-10 VITALS — Ht 69.5 in | Wt 210.0 lb

## 2019-04-10 DIAGNOSIS — G4730 Sleep apnea, unspecified: Secondary | ICD-10-CM

## 2019-04-10 DIAGNOSIS — R413 Other amnesia: Secondary | ICD-10-CM | POA: Diagnosis not present

## 2019-04-10 NOTE — Progress Notes (Signed)
Virtual Visit via Video Note The purpose of this virtual visit is to provide medical care while limiting exposure to the novel coronavirus.    Consent was obtained for video visit:  Yes.   Answered questions that patient had about telehealth interaction:  Yes.   I discussed the limitations, risks, security and privacy concerns of performing an evaluation and management service by telemedicine. I also discussed with the patient that there may be a patient responsible charge related to this service. The patient expressed understanding and agreed to proceed.  Pt location: Home Physician Location: office Name of referring provider:  Maurice Small, MD I connected with Sydney Ferguson at patients initiation/request on 04/10/2019 at 11:30 AM EDT by video enabled telemedicine application and verified that I am speaking with the correct person using two identifiers. Pt MRN:  [id number] Pt DOB:  11/29/1950 Video Participants:  Sydney Ferguson   History of Present Illness:  The patient was seen as a virtual video visit on 04/10/2019. She was last seen 7 months ago for worsening memory and word-finding difficulties. MMSE 30/30 in December 2019. She feels her memory is about the same, with good and bad days. She would mean to say a word and something else comes out, for instance she said "penguin" when meaning to say "puppy." She forgets what she went to get in a room. She denies getting lost driving. She manages her medications independently. Her husband manages finances. She had a sleep study showing mild sleep apnea and declined CPAP titration due to claustrophobia. She continues to deal with fibromyalgia and neuropathic pain, with tingling, burning, pins and needles up to her ankles. She is on gabapentin 300mg  TID in addition to Cymbalta. She reports her mood is "a hard one" due to Covid-19 restrictions, she gets depressed occasionally. She denies any headaches, dizziness, vision changes, no falls.    History on Initial Assessment 08/04/2017: This is a 68 yo RH woman with a history of atrial fibrillation, migraines, fibromyalgia, chronic fatigue syndrome, presenting for evaluation of worsening memory and neuropathy. She is very concerned due to strong family history of Alzheimer's disease in her family. Her mother and 6 maternal aunts had dementia. She started noticing word-finding difficulties around a year ago, not being able to find the right words, saying "cow" when thinking of "dog." Around 2 months ago, she put water for coffee in a different spot, which is similar to what her mother had done in the past, concerning her. Around 2 weeks ago, she got lost driving to her sister's house. She denies getting lost driving. She is pretty good with managing medications. Her husband is in charge of finances. She loses things a lot, forgetting what she was looking for. They endorse high stress over the past year taking care of her mother, with her parents passing away within a short span of each other. Her husband started noticing changes around a year ago, mostly with talking then hesitating looking for a word. He would end up finishing her sentences. No personality changes or hallucinations. No difficulties with ADLs. She denies any history of concussions. She has had a couple of drinks every night for the past 30 years.   She started having numbness, tingling, and sharp pain in both feet around 2-3 years ago. She saw Dr. Maceo Pro who felt the symptoms were related to her fibromyalgia. She saw her new PCP Dr. Justin Mend and was found to have diabetes. Her last HbA1c was 7 (improved from initial diagnosis).  She reports paresthesias are constant, gabapentin 300mg  qhs helps. She is also on Cymbalta for the fibromyalgia, she cannot walk if she misses a dose. Symptoms affect her foot up to the ankles. Hands are not affected. She denies any falls. She has rare migraines. She has some dizziness when standing. She gets choked  a lot when swallowing. She has a lot of neck pain, some back pain. No diplopia, dysarthria, bowel/bladder dysfunction, anosmia, or tremors.      Current Outpatient Medications on File Prior to Visit  Medication Sig Dispense Refill   ALPRAZolam (XANAX) 0.5 MG tablet Take 0.5 mg by mouth daily as needed for anxiety.   1   BIOTIN 5000 PO Take 1 capsule by mouth daily.     cetirizine (ZYRTEC) 10 MG tablet Take 10 mg by mouth daily as needed for allergies.     clonazePAM (KLONOPIN) 1 MG tablet Take 1 mg by mouth daily.     cyclobenzaprine (FLEXERIL) 10 MG tablet TAKE 1 TABLET BY MOUTH AT BEDTIME  0   DULoxetine (CYMBALTA) 60 MG capsule TAKE 1 CAPSULE ONCE A DAY  3   flecainide (TAMBOCOR) 100 MG tablet Take 1 tablet (100 mg total) by mouth 2 (two) times daily. 60 tablet 11   gabapentin (NEURONTIN) 100 MG capsule Take 100 mg by mouth 3 (three) times daily. Take 300mg  BID  11   glipiZIDE (GLUCOTROL) 5 MG tablet Take by mouth 2 (two) times daily before a meal.     meloxicam (MOBIC) 15 MG tablet Take 15 mg by mouth daily as needed for pain.     metoprolol succinate (TOPROL-XL) 25 MG 24 hr tablet Take 0.5 tablets (12.5 mg total) by mouth daily. 45 tablet 3   omeprazole (PRILOSEC) 40 MG capsule Take 40 mg by mouth daily.     pravastatin (PRAVACHOL) 40 MG tablet Take 40 mg daily by mouth.  11   zolmitriptan (ZOMIG) 5 MG tablet Take 5 mg by mouth daily as needed for migraine.      No current facility-administered medications on file prior to visit.      Observations/Objective:   Vitals:   04/10/19 1114  Weight: 210 lb (95.3 kg)  Height: 5' 9.5" (1.765 m)   GEN:  The patient appears stated age and is in NAD.  Neurological examination: Patient is awake, alert, oriented x 3. No aphasia or dysarthria. Intact fluency and comprehension. Remote and recent memory intact. Able to name and repeat. Cranial nerves: Extraocular movements intact with no nystagmus. No facial asymmetry. Motor: moves  all extremities symmetrically, at least anti-gravity x 4. No incoordination on finger to nose testing. Gait: narrow-based and steady, able to tandem walk adequately. Negative Romberg test.  Montreal Cognitive Assessment Blind 04/10/2019  Attention: Read list of digits (0/2) 2  Attention: Read list of letters (0/1) 1  Attention: Serial 7 subtraction starting at 100 (0/3) 3  Language: Repeat phrase (0/2) 2  Language : Fluency (0/1) 0  Abstraction (0/2) 2  Delayed Recall (0/5) 3  Orientation (0/6) 6  Total 19     Assessment and Plan:   This is a 68 yo RH woman with a history of  atrial fibrillation, migraines, fibromyalgia, chronic fatigue syndrome, strong family history of dementia, who presented with worsening memory and neuropathy. Memory is overall stable, MOCA Blind (done over phone) today normal 19/22. We again discussed different causes of memory changes, continue with monitoring mood, pain control, and any worsening symptoms from sleep apnea. We again discussed  the importance of control of vascular risk factors, physical exercise, and brain stimulation exercises for brain health. She will follow-up in 1 year and knows to call for any changes   Follow Up Instructions:    -I discussed the assessment and treatment plan with the patient. The patient was provided an opportunity to ask questions and all were answered. The patient agreed with the plan and demonstrated an understanding of the instructions.   The patient was advised to call back or seek an in-person evaluation if the symptoms worsen or if the condition fails to improve as anticipated.    Van ClinesKaren M Adia Crammer, MD

## 2019-05-02 IMAGING — MG DIGITAL SCREENING BILATERAL MAMMOGRAM WITH TOMO AND CAD
8 series · 9 of 24 positions shown · non-contrast
Comparison: Previous exam(s).

CLINICAL DATA: Screening.

EXAM:
DIGITAL SCREENING BILATERAL MAMMOGRAM WITH TOMO AND CAD

[L CC synth-2D]
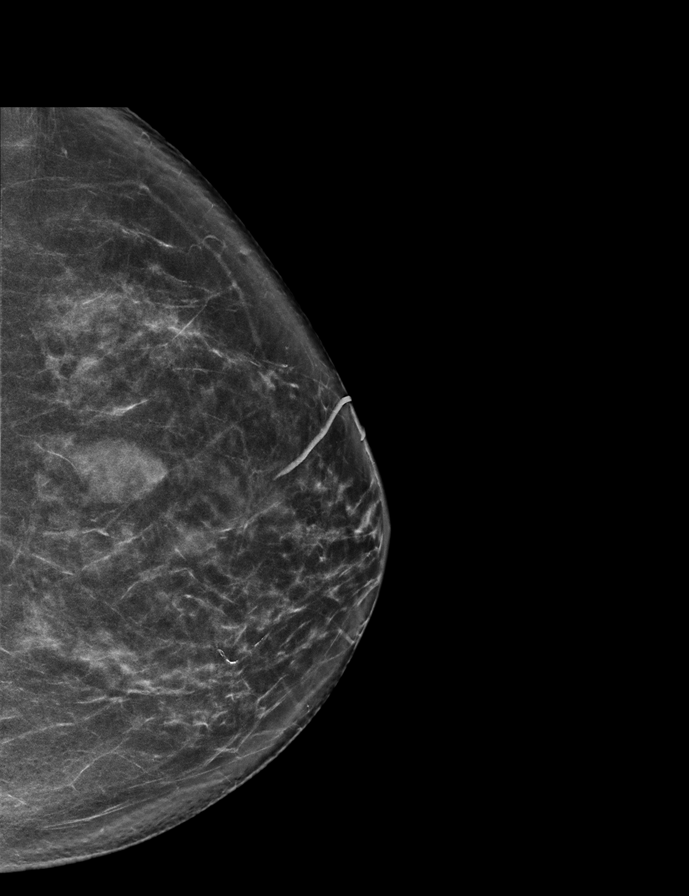

[R CC synth-2D]
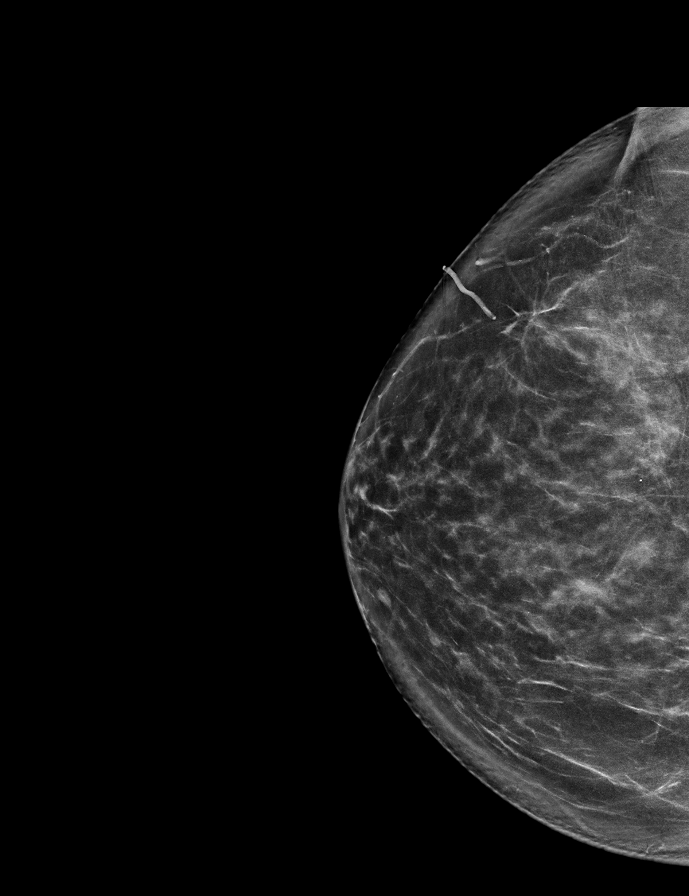

[L MLO synth-2D]
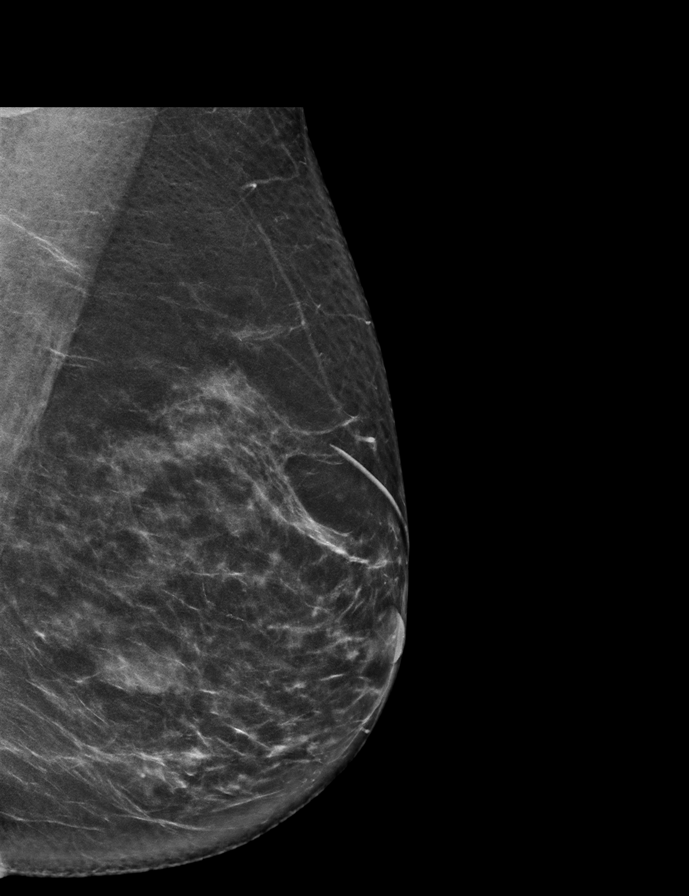

[R MLO synth-2D]
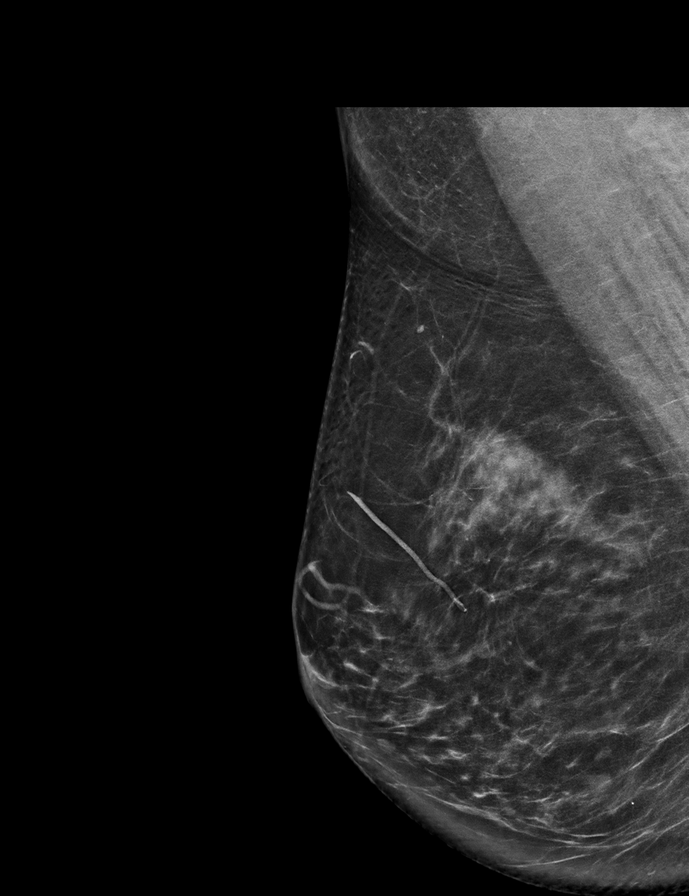

[R CC tomo · 2 of 73 frames shown]
[frame 24/73]
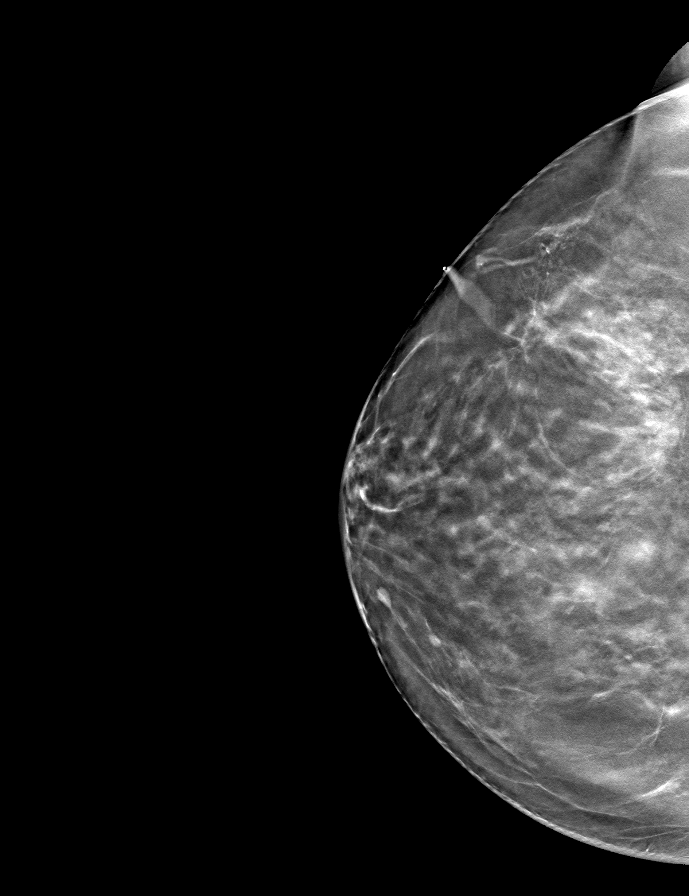
[frame 37/73]
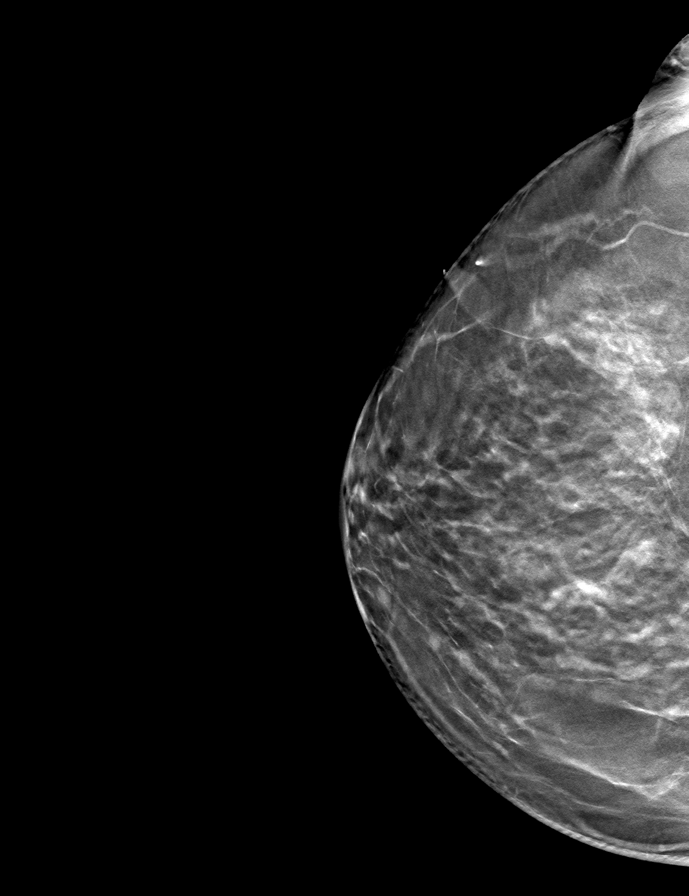

[R MLO tomo · tomo slice 39/78.0]
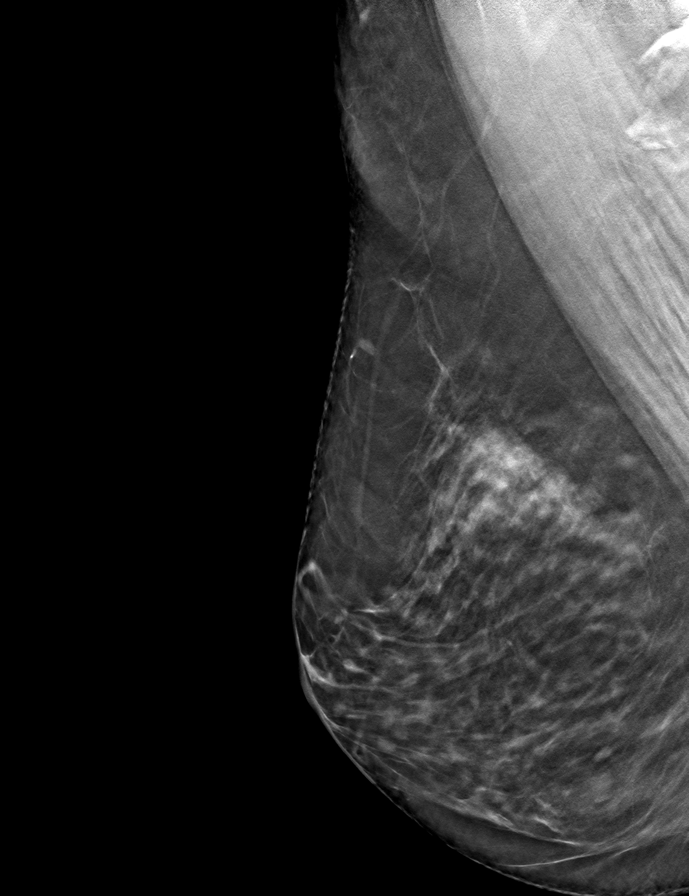

[L CC tomo · tomo slice 37/72.0]
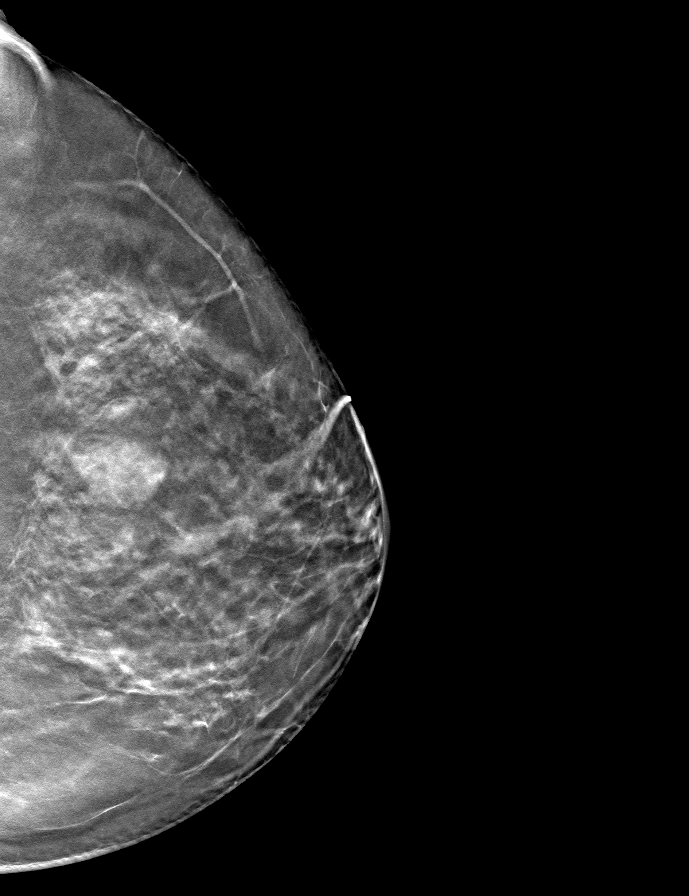

[L MLO tomo · tomo slice 39/76.0]
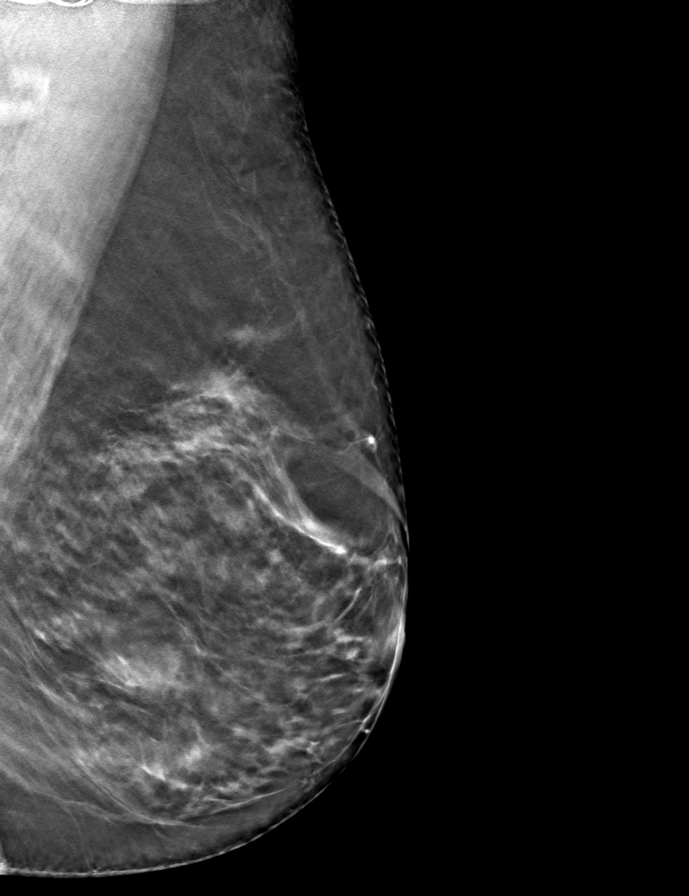

[9 of 24 positions shown; findings below may reference images not displayed]

ACR Breast Density Category b: There are scattered areas of
fibroglandular density.
FINDINGS: There are no findings suspicious for malignancy. Images were
processed with CAD.
IMPRESSION: No mammographic evidence of malignancy. A result letter of this
screening mammogram will be mailed directly to the patient.

RECOMMENDATION:
Screening mammogram in one year. (Code:[id number])

BI-RADS CATEGORY  1: Negative.

## 2019-06-18 NOTE — Progress Notes (Signed)
Cardiology Office Note:    Date:  06/19/2019   ID:  LINCY BELLES, DOB 1951/08/08, MRN [id number]  PCP:  Shirlean Mylar, MD  Cardiologist:  Armanda Magic, MD    Referring MD: Shirlean Mylar, MD   Chief Complaint  Patient presents with  . Atrial Fibrillation  . Hyperlipidemia    History of Present Illness:    Sydney Ferguson is a 68 y.o. female with a hx of PAF status post failed ablation 2002 on flecainide,Chadsvasc=84for female, age and DM. She also has a history ofGERD, chronic dyspnea on exertion secondary to weight gain with normal stress test and echo in 2014.  She is here today for followup and is doing well.  She denies any chest pain or pressure, SOB, DOE, PND, orthopnea, LE edema, dizziness, palpitations or syncope. She is compliant with her meds and is tolerating meds with no SE.    Past Medical History:  Diagnosis Date  . Atrial tachycardia (HCC)   . CFIDS (chronic fatigue and immune dysfunction syndrome) (HCC)   . Diabetes mellitus without complication (HCC)   . Endometriosis   . Fibromyalgia   . Hypertension   . Migraines   . PAF (paroxysmal atrial fibrillation) (HCC)    s/p failed ablation 2002    Past Surgical History:  Procedure Laterality Date  . ABLATION  2002   unsucessful  . BREAST BIOPSY Bilateral   . BREAST LUMPECTOMY    . PARTIAL HYSTERECTOMY      Current Medications: No outpatient medications have been marked as taking for the 06/19/19 encounter (Office Visit) with Quintella Reichert, MD.     Allergies:   Codeine   Social History   Socioeconomic History  . Marital status: Married    Spouse name: Not on file  . Number of children: Not on file  . Years of education: Not on file  . Highest education level: Not on file  Occupational History  . Not on file  Social Needs  . Financial resource strain: Not on file  . Food insecurity    Worry: Not on file    Inability: Not on file  . Transportation needs    Medical: Not on file   Non-medical: Not on file  Tobacco Use  . Smoking status: Former Smoker    Packs/day: 0.25    Types: Cigarettes    Quit date: 09/27/1992    Years since quitting: 26.7  . Smokeless tobacco: Never Used  Substance and Sexual Activity  . Alcohol use: Yes    Comment: rare  . Drug use: No  . Sexual activity: Not on file  Lifestyle  . Physical activity    Days per week: Not on file    Minutes per session: Not on file  . Stress: Not on file  Relationships  . Social Musician on phone: Not on file    Gets together: Not on file    Attends religious service: Not on file    Active member of club or organization: Not on file    Attends meetings of clubs or organizations: Not on file    Relationship status: Not on file  Other Topics Concern  . Not on file  Social History Narrative   Lives with husband in two story home      Right handed      Highest level of edu- 2 year college      Retired     Family History: The patient's family history  includes Alzheimer's disease in her mother; Arrhythmia in her father; Breast cancer in her mother; Dementia in her mother; Diabetes in her brother; Neuropathy in her father.  ROS:   Please see the history of present illness.    ROS  All other systems reviewed and negative.   EKGs/Labs/Other Studies Reviewed:    The following studies were reviewed today:   EKG:  EKG is  ordered today.  The ekg ordered today demonstrates NSR with no ST changes  Recent Labs: No results found for requested labs within last 8760 hours.   Recent Lipid Panel No results found for: CHOL, TRIG, HDL, CHOLHDL, VLDL, LDLCALC, LDLDIRECT  Physical Exam:    VS:  Ht 5' 9.5" (1.765 m)   Wt 212 lb 9.6 oz (96.4 kg)   BMI 30.95 kg/m     Wt Readings from Last 3 Encounters:  06/19/19 212 lb 9.6 oz (96.4 kg)  04/10/19 210 lb (95.3 kg)  10/24/18 216 lb (98 kg)     GEN:  Well nourished, well developed in no acute distress HEENT: Normal NECK: No JVD; No  carotid bruits LYMPHATICS: No lymphadenopathy CARDIAC: RRR, no murmurs, rubs, gallops RESPIRATORY:  Clear to auscultation without rales, wheezing or rhonchi  ABDOMEN: Soft, non-tender, non-distended MUSCULOSKELETAL:  No edema; No deformity  SKIN: Warm and dry NEUROLOGIC:  Alert and oriented x 3 PSYCHIATRIC:  Normal affect   ASSESSMENT:    1. PAF (paroxysmal atrial fibrillation) ([place])   2. Pure hypercholesterolemia   3. Controlled type 2 diabetes mellitus without complication, without long-term current use of insulin (HCC)    PLAN:    In order of problems listed above:  1.  Paroxysmal atrial fibrillation - she denies any palpitations and does not think that she has had any afib.  She will continue on Toprol and flecainide.  She has chose not to stay on anticoagulation.  She is fully aware of the risk of cardioembolic events given her CHADS2VASCS score of 2 and accepts the risk.    2.  HLD - LDL goal is < 70 due to DM.  LDL was 146 on 01/25/2019.  Continue on Pravachol.  Followed by PCP.  3.  DM - followed by her PCP. Continue on Glipizide.   Medication Adjustments/Labs and Tests Ordered: Current medicines are reviewed at length with the patient today.  Concerns regarding medicines are outlined above.  No orders of the defined types were placed in this encounter.  No orders of the defined types were placed in this encounter.   Signed, Fransico Him, MD  06/19/2019 9:08 AM    [place]

## 2019-06-19 ENCOUNTER — Ambulatory Visit: Payer: PPO | Admitting: Cardiology

## 2019-06-19 ENCOUNTER — Encounter: Payer: Self-pay | Admitting: Cardiology

## 2019-06-19 ENCOUNTER — Other Ambulatory Visit: Payer: Self-pay

## 2019-06-19 VITALS — BP 154/92 | HR 89 | Ht 69.5 in | Wt 212.6 lb

## 2019-06-19 DIAGNOSIS — I480 Paroxysmal atrial fibrillation: Secondary | ICD-10-CM | POA: Diagnosis not present

## 2019-06-19 DIAGNOSIS — E119 Type 2 diabetes mellitus without complications: Secondary | ICD-10-CM

## 2019-06-19 DIAGNOSIS — E7800 Pure hypercholesterolemia, unspecified: Secondary | ICD-10-CM | POA: Diagnosis not present

## 2019-06-19 NOTE — Patient Instructions (Signed)
Medication Instructions:  Your physician recommends that you continue on your current medications as directed. Please refer to the Current Medication list given to you today.  If you need a refill on your cardiac medications before your next appointment, please call your pharmacy.   Lab work: None Ordered  Testing/Procedures: None Ordered  Follow-Up: At [facility], you and your health needs are our priority.  As part of our continuing mission to provide you with exceptional heart care, we have created designated Provider Care Teams.  These Care Teams include your primary Cardiologist (physician) and Advanced Practice Providers (APPs -  Physician Assistants and Nurse Practitioners) who all work together to provide you with the care you need, when you need it. You will need a follow up appointment in 1 years.  Please call our office 2 months in advance to schedule this appointment.  You may see Fransico Him, MD or one of the following Advanced Practice Providers on your designated Care Team:   Fowler, PA-C Melina Copa, PA-C . Ermalinda Barrios, PA-C  Any Other Special Instructions Will Be Listed Below (If Applicable).

## 2019-07-06 ENCOUNTER — Other Ambulatory Visit: Payer: Self-pay | Admitting: Cardiology

## 2019-07-06 DIAGNOSIS — Z23 Encounter for immunization: Secondary | ICD-10-CM | POA: Diagnosis not present

## 2019-11-23 DIAGNOSIS — H8113 Benign paroxysmal vertigo, bilateral: Secondary | ICD-10-CM | POA: Diagnosis not present

## 2019-12-31 DIAGNOSIS — H8112 Benign paroxysmal vertigo, left ear: Secondary | ICD-10-CM | POA: Diagnosis not present

## 2020-02-27 DIAGNOSIS — E119 Type 2 diabetes mellitus without complications: Secondary | ICD-10-CM | POA: Diagnosis not present

## 2020-02-27 DIAGNOSIS — R5382 Chronic fatigue, unspecified: Secondary | ICD-10-CM | POA: Diagnosis not present

## 2020-02-27 DIAGNOSIS — Z1211 Encounter for screening for malignant neoplasm of colon: Secondary | ICD-10-CM | POA: Diagnosis not present

## 2020-02-27 DIAGNOSIS — I471 Supraventricular tachycardia: Secondary | ICD-10-CM | POA: Diagnosis not present

## 2020-02-27 DIAGNOSIS — Z5181 Encounter for therapeutic drug level monitoring: Secondary | ICD-10-CM | POA: Diagnosis not present

## 2020-02-27 DIAGNOSIS — E782 Mixed hyperlipidemia: Secondary | ICD-10-CM | POA: Diagnosis not present

## 2020-02-27 DIAGNOSIS — I480 Paroxysmal atrial fibrillation: Secondary | ICD-10-CM | POA: Diagnosis not present

## 2020-02-27 DIAGNOSIS — Z0000 Encounter for general adult medical examination without abnormal findings: Secondary | ICD-10-CM | POA: Diagnosis not present

## 2020-04-11 ENCOUNTER — Encounter: Payer: Self-pay | Admitting: Endocrinology

## 2020-04-11 ENCOUNTER — Other Ambulatory Visit: Payer: Self-pay

## 2020-04-11 ENCOUNTER — Ambulatory Visit: Payer: PPO | Admitting: Endocrinology

## 2020-04-11 VITALS — BP 136/84 | HR 103 | Ht 69.5 in | Wt 206.0 lb

## 2020-04-11 DIAGNOSIS — E119 Type 2 diabetes mellitus without complications: Secondary | ICD-10-CM

## 2020-04-11 LAB — POCT GLYCOSYLATED HEMOGLOBIN (HGB A1C): Hemoglobin A1C: 11.4 % — AB (ref 4.0–5.6)

## 2020-04-11 MED ORDER — RYBELSUS 7 MG PO TABS: 3.0000 mg | ORAL_TABLET | ORAL | 11 refills | Status: DC

## 2020-04-11 NOTE — Progress Notes (Signed)
Subjective:    Patient ID: Sydney Ferguson, female    DOB: [birth year], 69 y.o.   MRN: [id number]  HPI pt is referred by Dr Hyman Hopes, for diabetes.  Pt states DM was dx'ed in 2017; it is complicated by PN; she has never been on insulin; pt says her diet and exercise are fair; she has never had GDM, pancreatitis, pancreatic surgery, severe hypoglycemia or DKA.  She takes glipizide only now.  Main symptoms are pain and numbness of the feet.  Pt says cbg's are in the 200's.  She wants a trial of increased oral rx, rather than insulin.  Past Medical History:  Diagnosis Date   Atrial tachycardia (HCC)    CFIDS (chronic fatigue and immune dysfunction syndrome) (HCC)    Diabetes mellitus without complication (HCC)    Endometriosis    Fibromyalgia    Hypertension    Migraines    PAF (paroxysmal atrial fibrillation) (HCC)    s/p failed ablation 2002    Past Surgical History:  Procedure Laterality Date   ABLATION  2002   unsucessful   BREAST BIOPSY Bilateral    BREAST LUMPECTOMY     PARTIAL HYSTERECTOMY      Social History   Socioeconomic History   Marital status: Married    Spouse name: Not on file   Number of children: Not on file   Years of education: Not on file   Highest education level: Not on file  Occupational History   Not on file  Tobacco Use   Smoking status: Former Smoker    Packs/day: 0.25    Types: Cigarettes    Quit date: 09/27/1992    Years since quitting: 27.5   Smokeless tobacco: Never Used  Vaping Use   Vaping Use: Never used  Substance and Sexual Activity   Alcohol use: Yes    Comment: rare   Drug use: No   Sexual activity: Not on file  Other Topics Concern   Not on file  Social History Narrative   Lives with husband in two story home      Right handed      Highest level of edu- 2 year college      Retired   International aid/development worker of [employer] investment [employer] Strain:    Difficulty of Paying Living Expenses:   Food  Insecurity:    Worried About Programme researcher, broadcasting/film/video in the Last Year:    Barista in the Last Year:   Transportation Needs:    Freight forwarder (Medical):    Lack of Transportation (Non-Medical):   Physical Activity:    Days of Exercise per Week:    Minutes of Exercise per Session:   Stress:    Feeling of Stress :   Social Connections:    Frequency of Communication with Friends and Family:    Frequency of Social Gatherings with Friends and Family:    Attends Religious Services:    Active Member of Clubs or Organizations:    Attends Engineer, structural:    Marital Status:   Intimate Partner Violence:    Fear of Current or Ex-Partner:    Emotionally Abused:    Physically Abused:    Sexually Abused:     Current Outpatient Medications on File Prior to Visit  Medication Sig Dispense Refill   ALPRAZolam (XANAX) 0.5 MG tablet Take 0.5 mg by mouth daily as needed for anxiety.   1   BIOTIN 5000 PO Take  1 capsule by mouth daily.     cetirizine (ZYRTEC) 10 MG tablet Take 10 mg by mouth daily as needed for allergies.     clonazePAM (KLONOPIN) 1 MG tablet Take 1 mg by mouth daily.     cyclobenzaprine (FLEXERIL) 10 MG tablet TAKE 1 TABLET BY MOUTH AT BEDTIME  0   DULoxetine (CYMBALTA) 60 MG capsule TAKE 1 CAPSULE ONCE A DAY  3   flecainide (TAMBOCOR) 100 MG tablet TAKE 1 TABLET BY MOUTH TWICE A DAY 180 tablet 3   gabapentin (NEURONTIN) 100 MG capsule Take 100 mg by mouth 3 (three) times daily. Take 300mg  BID  11   glipiZIDE (GLUCOTROL) 5 MG tablet Take by mouth 2 (two) times daily before a meal.     meloxicam (MOBIC) 15 MG tablet Take 15 mg by mouth daily as needed for pain.     metoprolol succinate (TOPROL-XL) 25 MG 24 hr tablet TAKE 1/2 TABLET BY MOUTH EVERY DAY 45 tablet 3   omeprazole (PRILOSEC) 40 MG capsule Take 40 mg by mouth daily.     pravastatin (PRAVACHOL) 40 MG tablet Take 40 mg daily by mouth.  11   zolmitriptan (ZOMIG) 5 MG  tablet Take 5 mg by mouth daily as needed for migraine.      No current facility-administered medications on file prior to visit.    Allergies  Allergen Reactions   Codeine Nausea Only    Family History  Problem Relation Age of Onset   Dementia Mother    Breast cancer Mother    Alzheimer's disease Mother    Arrhythmia Father    Neuropathy Father    Diabetes Brother     BP 136/84 (BP Location: Right Arm, Patient Position: Sitting, Cuff Size: Normal)    Pulse (!) 103    Ht 5' 9.5" (1.765 m)    Wt 206 lb (93.4 kg)    SpO2 97%    BMI 29.98 kg/m    Review of Systems denies weight loss, blurry vision, chest pain, sob, n/v, urinary frequency, memory loss, and depression.  She has fatigue.       Objective:   Physical Exam VS: see vs page GEN: no distress HEAD: head: no deformity eyes: no periorbital swelling, no proptosis external nose and ears are normal NECK: supple, thyroid is not enlarged CHEST WALL: no deformity LUNGS: clear to auscultation CV: reg rate and rhythm, no murmur MUSCULOSKELETAL: muscle bulk and strength are grossly normal.  no obvious joint swelling.  gait is normal and steady EXTEMITIES: no deformity.  no ulcer on the feet.  feet are of normal color and temp.  Trace bilat leg edema.   PULSES: dorsalis pedis intact bilat.  no carotid bruit NEURO:  cn 2-12 grossly intact.   readily moves all 4's.  sensation is intact to touch on the feet, but severely decreased from normal.   SKIN:  Normal texture and temperature.  No rash or suspicious lesion is visible.   NODES:  None palpable at the neck PSYCH: alert, well-oriented.  Does not appear anxious nor depressed.    outside test results are reviewed: A1c=11.4% Creat=normal TSH=normal  I have reviewed outside records, and summarized: Pt was noted to have elevated A1c, and referred here.  She did not tolerate metformin (n/d).        Assessment & Plan:  Type 2 DM: severe exacerbation.  We discussed.   She wants to exhaust other options prior to starting insulin  Patient Instructions  good diet  and exercise significantly improve the control of your diabetes.  please let me know if you wish to be referred to a dietician.  high blood sugar is very risky to your health.  you should see an eye doctor and dentist every year.  It is very important to get all recommended vaccinations.  Controlling your blood pressure and cholesterol drastically reduces the damage diabetes does to your body.  Those who smoke should quit.  Please discuss these with your doctor.  check your blood sugar once a day.  vary the time of day when you check, between before the 3 meals, and at bedtime.  also check if you have symptoms of your blood sugar being too high or too low.  please keep a record of the readings and bring it to your next appointment here (or you can bring the meter itself).  You can write it on any piece of paper.  please call us sooner if your blood sugar goes below 70, or if you have a lot of readings over 200. I have sent a prescription to your pharmacy, to add "Rybelsus." To avoid nausea, please take 1/2 pill the first week. Our goal is to get the blood sugar to the low to mid-100's, without insulin if possible. Please call or message [place] in 2 weeks, to tell us how the blood sugar is doing. If necessary, we can add "Invokana." Please come back for a follow-up appointment in 6 weeks.

## 2020-04-11 NOTE — Patient Instructions (Addendum)
good diet and exercise significantly improve the control of your diabetes.  please let me know if you wish to be referred to a dietician.  high blood sugar is very risky to your health.  you should see an eye doctor and dentist every year.  It is very important to get all recommended vaccinations.  Controlling your blood pressure and cholesterol drastically reduces the damage diabetes does to your body.  Those who smoke should quit.  Please discuss these with your doctor.  check your blood sugar once a day.  vary the time of day when you check, between before the 3 meals, and at bedtime.  also check if you have symptoms of your blood sugar being too high or too low.  please keep a record of the readings and bring it to your next appointment here (or you can bring the meter itself).  You can write it on any piece of paper.  please call us sooner if your blood sugar goes below 70, or if you have a lot of readings over 200. I have sent a prescription to your pharmacy, to add "Rybelsus." To avoid nausea, please take 1/2 pill the first week. Our goal is to get the blood sugar to the low to mid-100's, without insulin if possible. Please call or message [place] in 2 weeks, to tell us how the blood sugar is doing. If necessary, we can add "Invokana." Please come back for a follow-up appointment in 6 weeks.

## 2020-05-02 ENCOUNTER — Ambulatory Visit: Payer: PPO | Admitting: Endocrinology

## 2020-05-05 ENCOUNTER — Ambulatory Visit: Payer: PPO | Admitting: Endocrinology

## 2020-05-12 DIAGNOSIS — Z03818 Encounter for observation for suspected exposure to other biological agents ruled out: Secondary | ICD-10-CM | POA: Diagnosis not present

## 2020-05-12 DIAGNOSIS — Z20822 Contact with and (suspected) exposure to covid-19: Secondary | ICD-10-CM | POA: Diagnosis not present

## 2020-05-23 ENCOUNTER — Ambulatory Visit: Payer: PPO | Admitting: Endocrinology

## 2020-05-23 ENCOUNTER — Other Ambulatory Visit: Payer: Self-pay

## 2020-05-23 ENCOUNTER — Encounter: Payer: Self-pay | Admitting: Endocrinology

## 2020-05-23 VITALS — BP 144/80 | HR 91 | Ht 69.5 in | Wt 205.8 lb

## 2020-05-23 DIAGNOSIS — E119 Type 2 diabetes mellitus without complications: Secondary | ICD-10-CM | POA: Diagnosis not present

## 2020-05-23 LAB — POCT GLYCOSYLATED HEMOGLOBIN (HGB A1C): Hemoglobin A1C: 10.3 % — AB (ref 4.0–5.6)

## 2020-05-23 MED ORDER — RYBELSUS 14 MG PO TABS: 14.0000 mg | ORAL_TABLET | Freq: Every day | ORAL | 11 refills | Status: DC

## 2020-05-23 NOTE — Progress Notes (Signed)
Subjective:    Patient ID: Sydney Ferguson, female    DOB: Mar 07, 1951, 69 y.o.   MRN: [id number]  HPI Pt returns for f/u of diabetes mellitus: DM type: 2 Dx'ed: 2017 Complications: PN Therapy: 2 oral meds GDM: never DKA: never Severe hypoglycemia: never Pancreatitis: never Pancreatic imaging:  SDOH:  Other: she has never been on insulin; she did not tolerate metformin (diarrhea); She wants a trial of increased oral rx, rather than insulin.  Interval history: she brings a record of her cbg's which I have reviewed today.  cbg varies from 148-255.   Past Medical History:  Diagnosis Date  . Atrial tachycardia (HCC)   . CFIDS (chronic fatigue and immune dysfunction syndrome) (HCC)   . Diabetes mellitus without complication (HCC)   . Endometriosis   . Fibromyalgia   . Hypertension   . Migraines   . PAF (paroxysmal atrial fibrillation) (HCC)    s/p failed ablation 2002    Past Surgical History:  Procedure Laterality Date  . ABLATION  2002   unsucessful  . BREAST BIOPSY Bilateral   . BREAST LUMPECTOMY    . PARTIAL HYSTERECTOMY      Social History   Socioeconomic History  . Marital status: Married    Spouse name: Not on file  . Number of children: Not on file  . Years of education: Not on file  . Highest education level: Not on file  Occupational History  . Not on file  Tobacco Use  . Smoking status: Former Smoker    Packs/day: 0.25    Types: Cigarettes    Quit date: 09/27/1992    Years since quitting: 27.6  . Smokeless tobacco: Never Used  Vaping Use  . Vaping Use: Never used  Substance and Sexual Activity  . Alcohol use: Yes    Comment: rare  . Drug use: No  . Sexual activity: Not on file  Other Topics Concern  . Not on file  Social History Narrative   Lives with husband in two story home      Right handed      Highest level of edu- 2 year college      Retired   International aid/development worker of Corporate investment banker Strain:   . Difficulty of Paying  Living Expenses: Not on file  Food Insecurity:   . Worried About Programme researcher, broadcasting/film/video in the Last Year: Not on file  . Ran Out of Food in the Last Year: Not on file  Transportation Needs:   . Lack of Transportation (Medical): Not on file  . Lack of Transportation (Non-Medical): Not on file  Physical Activity:   . Days of Exercise per Week: Not on file  . Minutes of Exercise per Session: Not on file  Stress:   . Feeling of Stress : Not on file  Social Connections:   . Frequency of Communication with Friends and Family: Not on file  . Frequency of Social Gatherings with Friends and Family: Not on file  . Attends Religious Services: Not on file  . Active Member of Clubs or Organizations: Not on file  . Attends Banker Meetings: Not on file  . Marital Status: Not on file  Intimate Partner Violence:   . Fear of Current or Ex-Partner: Not on file  . Emotionally Abused: Not on file  . Physically Abused: Not on file  . Sexually Abused: Not on file    Current Outpatient Medications on File Prior to Visit  Medication Sig Dispense Refill  . ALPRAZolam (XANAX) 0.5 MG tablet Take 0.5 mg by mouth daily as needed for anxiety.   1  . BIOTIN 5000 PO Take 1 capsule by mouth daily.    . cetirizine (ZYRTEC) 10 MG tablet Take 10 mg by mouth daily as needed for allergies.    . clonazePAM (KLONOPIN) 1 MG tablet Take 1 mg by mouth daily.    . cyclobenzaprine (FLEXERIL) 10 MG tablet TAKE 1 TABLET BY MOUTH AT BEDTIME  0  . DULoxetine (CYMBALTA) 60 MG capsule TAKE 1 CAPSULE ONCE A DAY  3  . flecainide (TAMBOCOR) 100 MG tablet TAKE 1 TABLET BY MOUTH TWICE A DAY 180 tablet 3  . gabapentin (NEURONTIN) 100 MG capsule Take 100 mg by mouth 3 (three) times daily. Take 300mg  BID  11  . glipiZIDE (GLUCOTROL) 5 MG tablet Take by mouth 2 (two) times daily before a meal.    . meloxicam (MOBIC) 15 MG tablet Take 15 mg by mouth daily as needed for pain.    . metoprolol succinate (TOPROL-XL) 25 MG 24 hr  tablet TAKE 1/2 TABLET BY MOUTH EVERY DAY 45 tablet 3  . omeprazole (PRILOSEC) 40 MG capsule Take 40 mg by mouth daily.    . pravastatin (PRAVACHOL) 40 MG tablet Take 40 mg daily by mouth.  11  . zolmitriptan (ZOMIG) 5 MG tablet Take 5 mg by mouth daily as needed for migraine.      No current facility-administered medications on file prior to visit.    Allergies  Allergen Reactions  . Codeine Nausea Only    Family History  Problem Relation Age of Onset  . Dementia Mother   . Breast cancer Mother   . Alzheimer's disease Mother   . Arrhythmia Father   . Neuropathy Father   . Diabetes Brother     BP (!) 144/80   Pulse 91   Ht 5' 9.5" (1.765 m)   Wt 205 lb 12.8 oz (93.4 kg)   SpO2 96%   BMI 29.96 kg/m   Review of Systems She denies hypoglycemia and nausea.      Objective:   Physical Exam VITAL SIGNS:  See vs page GENERAL: no distress Pulses: dorsalis pedis intact bilat.   MSK: no deformity of the feet CV: no leg edema Skin:  no ulcer on the feet.  normal color and temp on the feet.  Neuro: sensation is intact to touch on the feet, but decreased from normal.    Lab Results  Component Value Date   HGBA1C 10.3 (A) 05/23/2020       Assessment & Plan:  Type 2 DM: uncontrolled.  she declines to increase rybelsus now.   HTN: is noted today  Patient Instructions  Your blood pressure is high today.  Please see your primary [facility] provider soon, to have it rechecked check your blood sugar once a day.  vary the time of day when you check, between before the 3 meals, and at bedtime.  also check if you have symptoms of your blood sugar being too high or too low.  please keep a record of the readings and bring it to your next appointment here (or you can bring the meter itself).  You can write it on any piece of paper.  please call 05/25/2020 sooner if your blood sugar goes below 70, or if you have a lot of readings over 200. I have sent a prescription to your pharmacy, to increase the  Rybelsus again  Our goal is to get the blood sugar to the low to mid-100's, without insulin if possible.  If necessary, we can add "Invokana."  Please come back for a follow-up appointment in 6 more weeks.

## 2020-05-23 NOTE — Patient Instructions (Addendum)
Your blood pressure is high today.  Please see your primary care provider soon, to have it rechecked check your blood sugar once a day.  vary the time of day when you check, between before the 3 meals, and at bedtime.  also check if you have symptoms of your blood sugar being too high or too low.  please keep a record of the readings and bring it to your next appointment here (or you can bring the meter itself).  You can write it on any piece of paper.  please call us sooner if your blood sugar goes below 70, or if you have a lot of readings over 200. I have sent a prescription to your pharmacy, to increase the Rybelsus again Our goal is to get the blood sugar to the low to mid-100's, without insulin if possible.  If necessary, we can add "Invokana."  Please come back for a follow-up appointment in 6 more weeks.

## 2020-06-10 ENCOUNTER — Other Ambulatory Visit: Payer: Self-pay

## 2020-06-10 DIAGNOSIS — E119 Type 2 diabetes mellitus without complications: Secondary | ICD-10-CM

## 2020-06-10 MED ORDER — RYBELSUS 14 MG PO TABS: 14.0000 mg | ORAL_TABLET | Freq: Every day | ORAL | 11 refills | Status: DC

## 2020-06-22 ENCOUNTER — Other Ambulatory Visit: Payer: Self-pay | Admitting: Cardiology

## 2020-07-08 ENCOUNTER — Ambulatory Visit: Payer: PPO | Admitting: Endocrinology

## 2020-07-11 ENCOUNTER — Telehealth: Payer: Self-pay | Admitting: Endocrinology

## 2020-07-11 DIAGNOSIS — E119 Type 2 diabetes mellitus without complications: Secondary | ICD-10-CM

## 2020-07-11 MED ORDER — RYBELSUS 14 MG PO TABS: 14.0000 mg | ORAL_TABLET | Freq: Every day | ORAL | 0 refills | Status: DC

## 2020-07-11 NOTE — Telephone Encounter (Signed)
Patient called to update her new pharmacy with [place]. New pharmacy is [facility] [address] [place] Ph# [phone].   Pharmacy was not able/willing to move Rybelus from [place] to the [facility] location.  Patient only wants 3 months worth sent over because Rybelsus is no longer cost effective and she is going to contact her insurance to see which medications are covered in its place. Will call us back to advise    Patient will also be finding a new Endocrinologist in [place] area as she has moved there and will calling back to have a referral sent over for her.  Patient contact # [phone]

## 2020-07-20 ENCOUNTER — Other Ambulatory Visit: Payer: Self-pay | Admitting: Cardiology

## 2020-08-09 ENCOUNTER — Other Ambulatory Visit: Payer: Self-pay | Admitting: Cardiology

## 2020-08-19 ENCOUNTER — Other Ambulatory Visit: Payer: Self-pay | Admitting: Cardiology

## 2020-09-05 ENCOUNTER — Other Ambulatory Visit: Payer: Self-pay | Admitting: Cardiology

## 2020-09-12 ENCOUNTER — Other Ambulatory Visit: Payer: Self-pay | Admitting: Cardiology

## 2020-09-22 DIAGNOSIS — E1169 Type 2 diabetes mellitus with other specified complication: Secondary | ICD-10-CM | POA: Diagnosis not present

## 2020-09-22 DIAGNOSIS — I10 Essential (primary) hypertension: Secondary | ICD-10-CM | POA: Diagnosis not present

## 2020-09-22 DIAGNOSIS — Z79899 Other long term (current) drug therapy: Secondary | ICD-10-CM | POA: Diagnosis not present

## 2020-10-05 ENCOUNTER — Other Ambulatory Visit: Payer: Self-pay | Admitting: Cardiology

## 2020-10-14 ENCOUNTER — Other Ambulatory Visit: Payer: Self-pay | Admitting: Cardiology

## 2020-10-29 ENCOUNTER — Other Ambulatory Visit: Payer: Self-pay | Admitting: Cardiology

## 2020-11-07 ENCOUNTER — Other Ambulatory Visit: Payer: Self-pay | Admitting: Cardiology

## 2020-11-13 ENCOUNTER — Other Ambulatory Visit: Payer: Self-pay | Admitting: Cardiology

## 2021-06-10 ENCOUNTER — Other Ambulatory Visit: Payer: Self-pay | Admitting: Endocrinology

## 2021-06-10 DIAGNOSIS — E119 Type 2 diabetes mellitus without complications: Secondary | ICD-10-CM

## 2021-06-10 NOTE — Telephone Encounter (Signed)
Attempted to contact patient to schedule follow up appointment before refill but no answer and vm is full.

## 2021-06-16 ENCOUNTER — Other Ambulatory Visit: Payer: Self-pay | Admitting: Endocrinology

## 2021-06-16 DIAGNOSIS — E119 Type 2 diabetes mellitus without complications: Secondary | ICD-10-CM

## 2021-12-31 DIAGNOSIS — K219 Gastro-esophageal reflux disease without esophagitis: Secondary | ICD-10-CM | POA: Insufficient documentation

## 2021-12-31 DIAGNOSIS — F419 Anxiety disorder, unspecified: Secondary | ICD-10-CM | POA: Insufficient documentation

## 2023-10-27 LAB — HM MAMMOGRAPHY

## 2024-04-19 ENCOUNTER — Encounter: Payer: Self-pay | Admitting: Family Medicine

## 2024-04-20 ENCOUNTER — Encounter: Payer: Self-pay | Admitting: Family Medicine

## 2024-04-20 ENCOUNTER — Ambulatory Visit (INDEPENDENT_AMBULATORY_CARE_PROVIDER_SITE_OTHER): Admitting: Family Medicine

## 2024-04-20 VITALS — BP 126/79 | HR 80 | Temp 97.9°F | Ht 70.0 in | Wt 197.0 lb

## 2024-04-20 DIAGNOSIS — E7800 Pure hypercholesterolemia, unspecified: Secondary | ICD-10-CM | POA: Diagnosis not present

## 2024-04-20 DIAGNOSIS — F411 Generalized anxiety disorder: Secondary | ICD-10-CM | POA: Diagnosis not present

## 2024-04-20 DIAGNOSIS — Z0000 Encounter for general adult medical examination without abnormal findings: Secondary | ICD-10-CM | POA: Diagnosis not present

## 2024-04-20 DIAGNOSIS — E119 Type 2 diabetes mellitus without complications: Secondary | ICD-10-CM

## 2024-04-20 DIAGNOSIS — I10 Essential (primary) hypertension: Secondary | ICD-10-CM | POA: Diagnosis not present

## 2024-04-20 DIAGNOSIS — Z7984 Long term (current) use of oral hypoglycemic drugs: Secondary | ICD-10-CM

## 2024-04-20 NOTE — Progress Notes (Addendum)
 Office Note 04/22/2024  CC:  Chief Complaint  Patient presents with   Establish Care   Medical Management of Chronic Issues    HPI:  Sydney Ferguson is a 73 y.o. female who is here to establish care, health maintenance exam, follow-up diabetes, hypertension, and hypercholesterolemia. Patient's most recent primary [facility]: [facility] ) Old records in epic/health Link were reviewed prior to or during today's visit.  Says diabetes is relatively well-controlled over the years. Hemoglobin A1c 07/2023 was 6.9%.  LDL 93 in November 2024 . she describes some bilateral lower extremity peripheral neuropathy symptoms.   PMP AWARE reviewed today: most recent rx for alprazolam 0.5 mg was filled 03/21/2024, # 30, rx by Allie G Whit[facility], [facility]. No red flags.  Past Medical History:  Diagnosis Date   Allergy 1980   seasonal   Anxiety and depression    Arthritis 2005   Atrial tachycardia (HCC)    CFIDS (chronic fatigue and immune dysfunction syndrome) (HCC)    Diabetes mellitus without complication (HCC)    Fibromyalgia    GERD (gastroesophageal reflux disease)    Hypertension    Migraines    Nonmelanoma skin cancer    PAF (paroxysmal atrial fibrillation) (HCC)    s/p failed ablation 2002    Past Surgical History:  Procedure Laterality Date   ABDOMINAL HYSTERECTOMY  1980   endometriosis   ABLATION  2002   unsucessful   BREAST BIOPSY Bilateral    BREAST LUMPECTOMY     PARTIAL HYSTERECTOMY      Family History  Problem Relation Age of Onset   Dementia Mother    Breast cancer Mother    Alzheimer's disease Mother    Arthritis Mother    Asthma Mother    Cancer Mother    COPD Mother    Arrhythmia Father    Neuropathy Father    Arthritis Father    Asthma Father    COPD Father    Hearing loss Father    Heart disease Father    Arthritis Sister    Hearing loss Sister    Hyperlipidemia Sister    Cancer Brother     Diabetes Brother    Depression Brother    Alcohol abuse Brother    Arthritis Brother    Cancer Brother    Alcohol abuse Son    Depression Son    Hypertension Son    Stroke Son     Social History   Socioeconomic History   Marital status: Married    Spouse name: Not on file   Number of children: Not on file   Years of education: Not on file   Highest education level: 12th grade  Occupational History   Not on file  Tobacco Use   Smoking status: Former    Current packs/day: 0.00    Average packs/day: 0.5 packs/day for 3.0 years (1.5 ttl pk-yrs)    Types: Cigarettes    Quit date: 09/28/1999    Years since quitting: 24.5   Smokeless tobacco: Never  Vaping Use   Vaping status: Never Used  Substance and Sexual Activity   Alcohol use: Yes    Alcohol/week: 14.0 standard drinks of alcohol    Types: 14 Glasses of wine per week    Comment: rare   Drug use: No   Sexual activity: Not Currently    Birth control/protection: Post-menopausal  Other Topics Concern   Not on file  Social History Narrative  Married, Lives with husband in two story home.   One son.   Educ: 2 yr college.   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 Corporate investment banker Strain: Low Risk  (04/18/2024)   Overall Financial Resource Strain (CARDIA)    Difficulty of Paying Living Expenses: Not very hard  Food Insecurity: No Food Insecurity (04/18/2024)   Hunger Vital Sign    Worried About Running Out of Food in the Last Year: Never true    Ran Out of Food in the Last Year: Never true  Transportation Needs: No Transportation Needs (04/18/2024)   PRAPARE - Administrator, Civil Service (Medical): No    Lack of Transportation (Non-Medical): No  Physical Activity: Inactive (04/18/2024)   Exercise Vital Sign    Days of Exercise per Week: 0 days    Minutes of Exercise per Session: Not on file  Stress: Stress Concern Present (04/19/2024)   [employer] of Occupational Health - Occupational  Stress Questionnaire    Feeling of Stress: Rather much  Social Connections: Moderately Integrated (04/18/2024)   Social Connection and Isolation Panel    Frequency of Communication with Friends and Family: Twice a week    Frequency of Social Gatherings with Friends and Family: Once a week    Attends Religious Services: More than 4 times per year    Active Member of [employer] or Organizations: No    Attends Banker Meetings: Not on file    Marital Status: Married  Intimate Partner Violence: Unknown (04/18/2023)   Received from [facility]   HITS    Physically Hurt: Not on file    Insult or Talk Down To: Not on file    Threaten Physical Harm: Not on file    Scream or Curse: Not on file    Outpatient Encounter Medications as of 04/20/2024  Medication Sig   ALPRAZolam (XANAX) 0.5 MG tablet Take 0.5 mg by mouth daily as needed for anxiety.    cetirizine (ZYRTEC) 10 MG tablet Take 10 mg by mouth daily as needed for allergies.   cyclobenzaprine (FLEXERIL) 10 MG tablet Take 10 mg by mouth at bedtime.   DULoxetine (CYMBALTA) 60 MG capsule TAKE 1 CAPSULE ONCE A DAY   empagliflozin (JARDIANCE) 25 MG TABS tablet Take 25 mg by mouth daily.   flecainide  (TAMBOCOR ) 100 MG tablet Take 1 tablet (100 mg total) by mouth 2 (two) times daily. Please make overdue appt with Dr. Shlomo before anymore refills. Thank you 2nd attempt   glipiZIDE (GLUCOTROL) 5 MG tablet Take by mouth 2 (two) times daily before a meal.   metoprolol  succinate (TOPROL -XL) 25 MG 24 hr tablet Take 0.5 tablets (12.5 mg total) by mouth daily. Please call to schedule appointment for future refills. 3rd and FINAL attempt   omeprazole (PRILOSEC) 40 MG capsule Take 40 mg by mouth daily.   rosuvastatin (CRESTOR) 40 MG tablet Take 40 mg by mouth daily.   Semaglutide  (RYBELSUS ) 14 MG TABS Take 14 mg by mouth daily.   [DISCONTINUED] BIOTIN 5000 PO Take 1 capsule by mouth daily.   [DISCONTINUED] clonazePAM (KLONOPIN) 1 MG tablet Take 1  mg by mouth daily.   [DISCONTINUED] cyclobenzaprine (FLEXERIL) 10 MG tablet TAKE 1 TABLET BY MOUTH AT BEDTIME   [DISCONTINUED] gabapentin (NEURONTIN) 100 MG capsule Take 100 mg by mouth 3 (three) times daily. Take 300mg  BID   [DISCONTINUED] meloxicam (MOBIC) 15 MG tablet Take 15 mg by mouth daily as needed for pain.   [  DISCONTINUED] pravastatin (PRAVACHOL) 40 MG tablet Take 40 mg daily by mouth.   [DISCONTINUED] zolmitriptan (ZOMIG) 5 MG tablet Take 5 mg by mouth daily as needed for migraine.    No facility-administered encounter medications on file as of 04/20/2024.    Allergies  Allergen Reactions   Codeine Nausea Only   Metformin And Related Nausea Only    Review of Systems  Constitutional:  Negative for appetite change, chills, fatigue and fever.  HENT:  Negative for congestion, dental problem, ear pain and sore throat.   Eyes:  Negative for discharge, redness and visual disturbance.  Respiratory:  Negative for cough, chest tightness, shortness of breath and wheezing.   Cardiovascular:  Negative for chest pain, palpitations and leg swelling.  Gastrointestinal:  Negative for abdominal pain, blood in stool, diarrhea, nausea and vomiting.  Genitourinary:  Negative for difficulty urinating, dysuria, flank pain, frequency, hematuria and urgency.  Musculoskeletal:  Negative for arthralgias, back pain, joint swelling, myalgias and neck stiffness.  Skin:  Negative for pallor and rash.  Neurological:  Negative for dizziness, speech difficulty, weakness and headaches.  Hematological:  Negative for adenopathy. Does not bruise/bleed easily.  Psychiatric/Behavioral:  Negative for confusion and sleep disturbance. The patient is not nervous/anxious.     PE; Blood pressure 126/79, pulse 80, temperature 97.9 F (36.6 C), temperature source Oral, height 5' 10 (1.778 m), weight 197 lb (89.4 kg), SpO2 99%. Body mass index is 28.27 kg/m.  Physical Exam  Gen: Alert, well appearing.  Patient is  oriented to person, place, time, and situation. AFFECT: pleasant, lucid thought and speech. ENT: Ears: EACs clear, normal epithelium.  TMs with good light reflex and landmarks bilaterally.  Eyes: no injection, icteris, swelling, or exudate.  EOMI, PERRLA. Nose: no drainage or turbinate edema/swelling.  No injection or focal lesion.  Mouth: lips without lesion/swelling.  Oral mucosa pink and moist.  Dentition intact and without obvious caries or gingival swelling.  Oropharynx without erythema, exudate, or swelling.  Neck: supple/nontender.  No LAD, mass, or TM.  Carotid pulses 2+ bilaterally, without bruits. CV: RRR, no m/r/g.   LUNGS: CTA bilat, nonlabored resps, good aeration in all lung fields. ABD: soft, NT, ND, BS normal.  No hepatospenomegaly or mass.  No bruits. EXT: no clubbing, cyanosis, or edema.  Musculoskeletal: no joint swelling, erythema, warmth, or tenderness.  ROM of all joints intact. Skin - no sores or suspicious lesions or rashes or color changes  Pertinent labs:   Last metabolic panel Lab Results  Component Value Date   GLUCOSE 90 04/20/2024   NA 139 04/20/2024   K 3.9 04/20/2024   CL 99 04/20/2024   CO2 28 04/20/2024   BUN 18 04/20/2024   CREATININE 0.61 04/20/2024   GFRNONAA 93 05/24/2017   CALCIUM 10.3 04/20/2024   PROT 7.4 04/20/2024   BILITOT 0.5 04/20/2024   AST 23 04/20/2024   ALT 34 (H) 04/20/2024   Lab Results  Component Value Date   HGBA1C 7.3 (H) 04/20/2024   Lab Results  Component Value Date   TSH 1.55 08/04/2017   ASSESSMENT AND PLAN:   New patient, establishing care.  #1 health maintenance exam: Reviewed age and gender appropriate health maintenance issues (prudent diet, regular exercise, health risks of tobacco and excessive alcohol, use of seatbelts, fire alarms in home, use of sunscreen).  Also reviewed age and gender appropriate health screening as well as vaccine recommendations. Vaccines: Shingrix discussed, she declined for now.   Otherwise up-to-date. Labs: CMET, hemoglobin A1c,  urine microalbumin/creatinine. Cervical ca screening: hx of total hysterectomy for benign diagnosis. Breast ca screening: mammo neg 09/2023 Colon ca screening: Was due for recall 2024.  Per EMR it looks like she chose to reconsider/reschedule. Osteoporosis screening: We will need to get DEXA ordered at next visit.  #2 diabetes with peripheral neuropathy. Feet exam normal today. Monitor hemoglobin A1c, serum creatinine, and urine microalbumin/creatinine today. Continue Rybelsus  14 mg a day , jardiance 25mg  every day, and glipizide 5 mg twice daily. Continue Cymbalta 60 mg a day.  #3 hypertension, well-controlled on Toprol -XL 12.5 mg a day and lisinopril 10 mg a day.  #4 hypercholesterolemia, currently taking rosuvastatin 40 mg a day. Not fasting. Check fasting lipid panel at next follow-up visit.  5.  GAD. Continue 60 mg Cymbalta daily. She takes alprazolam rarely.  #6 PAF. She has declined anticoagulation. She is on flecainide  and Toprol . She has a cardiologist in [place] .  An After Visit Summary was printed and given to the patient.  Follow up: 3 months  Signed:  Gerlene Hockey, MD           04/22/2024

## 2024-04-21 LAB — COMPREHENSIVE METABOLIC PANEL WITH GFR
AG Ratio: 1.6 (calc) (ref 1.0–2.5)
ALT: 34 U/L — ABNORMAL HIGH (ref 6–29)
AST: 23 U/L (ref 10–35)
Albumin: 4.6 g/dL (ref 3.6–5.1)
Alkaline phosphatase (APISO): 68 U/L (ref 37–153)
BUN: 18 mg/dL (ref 7–25)
CO2: 28 mmol/L (ref 20–32)
Calcium: 10.3 mg/dL (ref 8.6–10.4)
Chloride: 99 mmol/L (ref 98–110)
Creat: 0.61 mg/dL (ref 0.60–1.00)
Globulin: 2.8 g/dL (ref 1.9–3.7)
Glucose, Bld: 90 mg/dL (ref 65–99)
Potassium: 3.9 mmol/L (ref 3.5–5.3)
Sodium: 139 mmol/L (ref 135–146)
Total Bilirubin: 0.5 mg/dL (ref 0.2–1.2)
Total Protein: 7.4 g/dL (ref 6.1–8.1)
eGFR: 95 mL/min/1.73m2 (ref >=60)

## 2024-04-21 LAB — HEMOGLOBIN A1C
Hgb A1c MFr Bld: 7.3 % — ABNORMAL HIGH (ref <5.7)
Mean Plasma Glucose: 163 mg/dL
eAG (mmol/L): 9 mmol/L

## 2024-04-21 LAB — MICROALBUMIN / CREATININE URINE RATIO
Creatinine, Urine: 56 mg/dL (ref 20–275)
Microalb Creat Ratio: 36 mg/g{creat} — ABNORMAL HIGH (ref <30)
Microalb, Ur: 2 mg/dL

## 2024-04-22 ENCOUNTER — Encounter: Payer: Self-pay | Admitting: Family Medicine

## 2024-04-22 ENCOUNTER — Ambulatory Visit: Payer: Self-pay | Admitting: Family Medicine

## 2024-07-03 ENCOUNTER — Encounter: Payer: Self-pay | Admitting: Family Medicine

## 2024-07-03 DIAGNOSIS — I480 Paroxysmal atrial fibrillation: Secondary | ICD-10-CM

## 2024-07-04 NOTE — Telephone Encounter (Signed)
OK, cardiology referral ordered.

## 2024-07-18 ENCOUNTER — Other Ambulatory Visit: Payer: Self-pay | Admitting: Family Medicine

## 2024-07-18 NOTE — Telephone Encounter (Unsigned)
 Copied from CRM [id number]. Topic: Clinical - Medication Refill >> Jul 18, 2024 12:40 PM [place] wrote: Medication: ALPRAZolam (XANAX) 0.5 MG tablet  Has the patient contacted their pharmacy? Yes (Agent: If no, request that the patient contact the pharmacy for the refill. If patient does not wish to contact the pharmacy document the reason why and proceed with request.) (Agent: If yes, when and what did the pharmacy advise?)  This is the patient's preferred pharmacy:   [phone] [place] [place] - [address] [place] [address] Phone: [phone] Fax: [phone]  Is this the correct pharmacy for this prescription? Yes If no, delete pharmacy and type the correct one.   Has the prescription been filled recently? Yes  Is the patient out of the medication? Yes  Has the patient been seen for an appointment in the last year OR does the patient have an upcoming appointment? Yes  Can we respond through MyChart? Yes  Agent: Please be advised that Rx refills may take up to 3 business days. We ask that you follow-up with your pharmacy.

## 2024-07-20 ENCOUNTER — Other Ambulatory Visit: Payer: Self-pay

## 2024-07-20 MED ORDER — ALPRAZOLAM 0.5 MG PO TABS: 0.5000 mg | ORAL_TABLET | Freq: Every day | ORAL | 1 refills | Status: DC | PRN

## 2024-07-20 NOTE — Telephone Encounter (Signed)
 Requesting:  alprazolam Contract: N/A UDS: N/A Last Visit: 04/20/24 Next Visit: advised to f/u 3 months Last Refill: 06/13/14, historical provider  Please Advise. Rx pending

## 2024-07-20 NOTE — Telephone Encounter (Signed)
 Prescription sent. Need to follow-up in the next 1 month.

## 2024-08-09 ENCOUNTER — Other Ambulatory Visit: Payer: Self-pay | Admitting: Family Medicine

## 2024-08-09 MED ORDER — EMPAGLIFLOZIN 25 MG PO TABS: 25.0000 mg | ORAL_TABLET | Freq: Every day | ORAL | 0 refills | Status: DC

## 2024-08-09 NOTE — Telephone Encounter (Signed)
 Copied from CRM #[id number]. Topic: Clinical - Medication Refill >> Aug 09, 2024 10:58 AM Brittany M wrote: Medication: empagliflozin (JARDIANCE) 25 MG TABS tablet  Has the patient contacted their pharmacy? Yes (Agent: If no, request that the patient contact the pharmacy for the refill. If patient does not wish to contact the pharmacy document the reason why and proceed with request.) (Agent: If yes, when and what did the pharmacy advise?)  This is the patient's preferred pharmacy:    [phone] [place], [place] - [address] [place] [address] Phone: [phone] Fax: [phone]  Is this the correct pharmacy for this prescription? Yes If no, delete pharmacy and type the correct one.   Has the prescription been filled recently? Yes  Is the patient out of the medication? Yes  Has the patient been seen for an appointment in the last year OR does the patient have an upcoming appointment? Yes  Can we respond through MyChart? Yes  Agent: Please be advised that Rx refills may take up to 3 business days. We ask that you follow-up with your pharmacy.

## 2024-08-15 ENCOUNTER — Encounter: Payer: Self-pay | Admitting: Cardiology

## 2024-08-15 ENCOUNTER — Ambulatory Visit: Attending: Cardiology | Admitting: Cardiology

## 2024-08-15 VITALS — BP 114/60 | HR 78 | Ht 69.5 in | Wt 192.4 lb

## 2024-08-15 DIAGNOSIS — E7800 Pure hypercholesterolemia, unspecified: Secondary | ICD-10-CM | POA: Diagnosis not present

## 2024-08-15 DIAGNOSIS — I480 Paroxysmal atrial fibrillation: Secondary | ICD-10-CM | POA: Diagnosis not present

## 2024-08-15 MED ORDER — ROSUVASTATIN CALCIUM 40 MG PO TABS: 40.0000 mg | ORAL_TABLET | Freq: Every day | ORAL | 3 refills | Status: AC

## 2024-08-15 MED ORDER — FLECAINIDE ACETATE 100 MG PO TABS: 100.0000 mg | ORAL_TABLET | Freq: Two times a day (BID) | ORAL | 3 refills | Status: DC

## 2024-08-15 MED ORDER — LISINOPRIL 10 MG PO TABS
10.0000 mg | ORAL_TABLET | Freq: Every day | ORAL | 3 refills | Status: AC
Start: 2024-08-15 — End: ?

## 2024-08-15 MED ORDER — METOPROLOL SUCCINATE ER 25 MG PO TB24: 12.5000 mg | ORAL_TABLET | Freq: Every day | ORAL | 3 refills | Status: AC

## 2024-08-15 NOTE — Progress Notes (Signed)
 Cardiology Office Note:  .   Date:  08/18/2024  ID:  Sydney Ferguson, DOB [birth year], MRN [id number] PCP: Candise Aleene VEAR, MD  [facility] Providers Cardiologist:  None     Chief Complaint  Patient presents with   New Patient (Initial Visit)    Establish cardiology care.  History of A-fib   Atrial Fibrillation    Had ablation in early 2000's.  Recurrent PAF since.  Management flecainide  and metoprolol .  No further breakthrough spells.    Patient Profile: .     Sydney Ferguson is a  73 y.o. female with a PMH noted below, who presents here to reestablish cardiology care in [place] .  She presents at the request of McGowen, Aleene VEAR, MD.  PMH: PAF (2020~)  = s/p PVI Ablation ~2002.  Declines DOAC Dm-2 HTN HLD GAD with night terrors Significant allergies GERD    Inocente VEAR Forehand's most recent cardiology evaluation was with Mabel Brill, MD from [facility] on October 10, 2023.  Noted no recurrent episodes of A-fib.  Maintaining sinus rhythm on metoprolol  and flecainide .  Refused anticoagulation, stable on 81 mg aspirin.  Despite having significant stress between her brother's health and moving to  , she has not had breakthrough spells of A-fib. Subjective  Discussed the use of AI scribe software for clinical note transcription with the patient, who gave verbal consent to proceed.  History of Present Illness Sydney Ferguson is a 73 year old female with atrial fibrillation who presents for cardiology follow-up.  She was diagnosed with atrial fibrillation over 20 years ago and underwent an ablation in 2010 at [facility], which was unsuccessful. Since then, she has been managed with medication. Initially, she was on an unspecified medication that was ineffective, leading to a switch to flecainide , which has been effective in controlling her symptoms. She is currently on flecainide  100 mg twice daily and metoprolol  12.5 mg daily. Her last  episode of atrial fibrillation was approximately two years ago, which was managed by increasing the dose of flecainide .  She has a history of hypertension, managed with metoprolol  and lisinopril  10 mg daily.  She has type 2 diabetes mellitus, managed with oral semaglutide  (Rybelsus ), Jardiance , and glipizide. She also takes an 81 mg aspirin daily.  She experiences anxiety and sleep disturbances, exacerbated by recent family stressors, including the passing of her brother and her son's stroke. She uses Xanax  more frequently for sleep and anxiety. She also takes Cymbalta 60 mg daily, which helps with both anxiety and neuropathy.  No recent episodes of heart racing, skipping, shortness of breath, chest pain, or pressure. She has not required cardioversion and has not experienced a stroke. A stress test in 2014 was normal.     Objective   Medications - Aspirin 81 mg = declined DOAC - Flecainide  100 mg twice a day; - Metoprolol  12.5 mg - Lisinopril  10 mg - Semaglutide  14 mg p.o. daily; - Jardiance  25 mg daily;- Glipizide 5 mg twice daily  - Prilosec 40 mg - Cymbalta 60 mg - Xanax  0.5 mg as needed - Flexeril 10 mg nightly  - Cetirizine 10 mg PRN allergy   PSH: ?  Afibrillation 2006 Hysterectomy 1997  Family History  Mother Brantley Riddles): Hypertension, Alzheimer's, lung disease-COPD, thyroid  disease, breast cancer, arthritis Father Sharyne Riddles): A-fib, COPD.  Blind Sister Candra Wegner): Arthritis Brother Elnora Hubbert): Diabetes, cancer, drug use Son-had a stroke  Social History The patient has been experiencing significant stress due  to family health issues, including the passing of a brother and a son who had a stroke. The patient has anxiety and sleep issues, for which she takes Xanax  and Cymbalta. She has recently moved back to [place] after living at [place] for five years..  Non-smoker.  Studies Reviewed: SABRA   EKG Interpretation Date/Time:  Wednesday August 15 2024 14:34:49 EST Ventricular Rate:  78 PR Interval:  160 QRS Duration:  84 QT Interval:  396 QTC Calculation: 451 R Axis:   18  Text Interpretation: Normal sinus rhythm Normal ECG No previous ECGs available Confirmed by Anner Lenis ([id number]) on 08/18/2024 5:23:34 PM    Labs from PCP  04/20/2024: Glucose 90, BUN 18, Cr 0.61, NA 139, K+ 3.9.  A1c 7.3 08/10/2023: TC 182, TG 201, HDL 55, LDL 93  Results DIAGNOSTIC Electrophysiology study: Failed ablation (2006) Stress test: Normal (2014) Transthoracic Echo (TTE) 04/18/2022: Normal left ventricular size and systolic function. No left ventricular wall motion abnormalities. LVEF (normal): 60-65%. Grade 1 diastolic dysfunction. No hemodynamically significant valvular disease. There are no prior studies for comparison.  Event Monitor 12/01/2021: Underlying sinus rhythm, average heart rate 79 bpm, no significant tachycardia or bradycardia arrhythmias   Risk Assessment/Calculations:    CHA2DS2-VASc Score = 4   This indicates a 4.8% annual risk of stroke. The patient's score is based upon: CHF History: 0 HTN History: 1 Diabetes History: 1 Stroke History: 0 Vascular Disease History: 0 Age Score: 1 Gender Score: 1  Per Pt Choice - NO DOAC  Patient declines DOAC      Physical Exam:   VS:  BP 114/60 (BP Location: Left Arm, Patient Position: Sitting, Cuff Size: Large)   Pulse 78   Ht 5' 9.5 (1.765 m)   Wt 192 lb 6.4 oz (87.3 kg)   SpO2 98%   BMI 28.01 kg/m    Wt Readings from Last 3 Encounters:  08/15/24 192 lb 6.4 oz (87.3 kg)  04/20/24 197 lb (89.4 kg)  05/23/20 205 lb 12.8 oz (93.4 kg)     GEN: Well nourished, well groomed in no acute distress; appearing NECK: No JVD; No carotid bruits CARDIAC: Normal S1, S2; RRR, no murmurs, rubs, gallops RESPIRATORY:  Clear to auscultation without rales, wheezing or rhonchi ; nonlabored, good air movement. ABDOMEN: Soft, non-tender, non-distended EXTREMITIES:  No edema; No deformity       ASSESSMENT AND PLAN: .    Problem List Items Addressed This Visit       Cardiology Problems   Hyperlipidemia (Chronic)   Moitored by PCP.  Labs should be checked soon.  LDL is less than 100.  She has not had any active anginal type symptoms to suggest CAD.  She does have diabetes and hypertension as risk factors.  Could consider risk stratification with hs-CRP plus or minus Coronary Calcium  Score to help determine goals of care.      Relevant Medications   aspirin EC 81 MG tablet   flecainide  (TAMBOCOR ) 100 MG tablet   metoprolol  succinate (TOPROL -XL) 25 MG 24 hr tablet   rosuvastatin  (CRESTOR ) 40 MG tablet   lisinopril  (ZESTRIL ) 10 MG tablet   PAF (paroxysmal atrial fibrillation) (HCC) - Primary (Chronic)   Paroxysmal atrial fibrillation with unsuccessful ablation in the early 2000's.  Had recurrence that was quite symptomatic, was started on flecainide . Well-controlled on flecainide  and metoprolol . Last episode over two years ago, with a few minor breakthroughs since.  CHADS-VASc score 4.  Discussed cardioversion and newer ablation techniques. -  Continue flecainide  100 mg twice daily. - Continue metoprolol  succinate 12.5 mg daily. - If symptomatic, take extra metoprolol  dose and flecainide  early. - If she has recurrent episodes of, would plan for urgent evaluation in the AFib clinic to ensure rapid assessment - Follow-up with PA in 6 months and cardiologist in 1 year.      Relevant Medications   aspirin EC 81 MG tablet   flecainide  (TAMBOCOR ) 100 MG tablet   metoprolol  succinate (TOPROL -XL) 25 MG 24 hr tablet   rosuvastatin  (CRESTOR ) 40 MG tablet   lisinopril  (ZESTRIL ) 10 MG tablet   Other Relevant Orders   EKG 12-Lead (Completed)        Follow-Up: Return in about 6 months (around 02/12/2025) for Alternate 6 month follow-up with APP & MD.  I spent 45 minutes in the care of [name] today including reviewing outside labs from labs from PCP and prior  cardiologist via Care Everywhere (2 minutes), reviewing outside studies (from Care Everywhere-6 minutes), face to face time discussing treatment options (18 minutes), reviewing records from PCP notes but also prior cardiology notes (6 minutes), 13 minutes dictating, updating records, and documenting in the encounter.      Signed, Alm MICAEL Clay, MD, MS Alm Clay, M.D., M.S. Interventional Cardiologist  [facility] Pager # [phone]

## 2024-08-15 NOTE — Patient Instructions (Signed)
 Medication Instructions:  NO CHANGES  Lab Work: NONE TO BE DONE TODAY.  Testing/Procedures: NONE  Follow-Up: At [facility], you and your health needs are our priority.  As part of our continuing mission to provide you with exceptional heart care, our providers are all part of one team.  This team includes your primary Cardiologist (physician) and Advanced Practice Providers or APPs (Physician Assistants and Nurse Practitioners) who all work together to provide you with the care you need, when you need it.  Your next appointment:   6 MONTHS WITH APP (EMILY MONGE OR KATLYN WEST) 1 YEAR WITH DR. HARDING  Provider:   DR. HARDING   We recommend signing up for the patient portal called MyChart.  Sign up information is provided on this After Visit Summary.  MyChart is used to connect with patients for Virtual Visits (Telemedicine).  Patients are able to view lab/test results, encounter notes, upcoming appointments, etc.  Non-urgent messages can be sent to your provider as well.   To learn more about what you can do with MyChart, go to [email or web address].

## 2024-08-18 ENCOUNTER — Encounter: Payer: Self-pay | Admitting: Cardiology

## 2024-08-18 NOTE — Assessment & Plan Note (Signed)
 Paroxysmal atrial fibrillation with unsuccessful ablation in the early 2000's.  Had recurrence that was quite symptomatic, was started on flecainide . Well-controlled on flecainide  and metoprolol . Last episode over two years ago, with a few minor breakthroughs since.  CHADS-VASc score 4.  Discussed cardioversion and newer ablation techniques. - Continue flecainide  100 mg twice daily. - Continue metoprolol  succinate 12.5 mg daily. - If symptomatic, take extra metoprolol  dose and flecainide  early. - If she has recurrent episodes of, would plan for urgent evaluation in the AFib clinic to ensure rapid assessment - Follow-up with PA in 6 months and cardiologist in 1 year.

## 2024-08-18 NOTE — Assessment & Plan Note (Signed)
 Moitored by PCP.  Labs should be checked soon.  LDL is less than 100.  She has not had any active anginal type symptoms to suggest CAD.  She does have diabetes and hypertension as risk factors.  Could consider risk stratification with hs-CRP plus or minus Coronary Calcium  Score to help determine goals of care.

## 2024-08-20 ENCOUNTER — Telehealth: Payer: Self-pay | Admitting: Cardiology

## 2024-08-20 MED ORDER — FLECAINIDE ACETATE 50 MG PO TABS: 50.0000 mg | ORAL_TABLET | Freq: Two times a day (BID) | ORAL | 3 refills | Status: AC

## 2024-08-20 NOTE — Telephone Encounter (Signed)
  Pt c/o medication issue:  1. Name of Medication:   flecainide  (TAMBOCOR ) 100 MG tablet    2. How are you currently taking this medication (dosage and times per day)? Take 1 tablet (100 mg total) by mouth 2 (two) times daily.   3. Are you having a reaction (difficulty breathing--STAT)? No   4. What is your medication issue? Patient said she's been taking 50 mg of her flecainide  twice a day. She is concern that 100 mg twice a day will be too much for her

## 2024-08-20 NOTE — Telephone Encounter (Signed)
 Patient is aware Dr Anner was okay for her to continue  with 50 mg  twice daily .   New prescription sent into the pharmacy. Per patient ,she did not pick up the last prescription of 100 mg dose.  Medication list

## 2024-08-20 NOTE — Telephone Encounter (Signed)
  I just saw her and asked about medications.  Her medicine list said 100 mg twice daily.  I am perfectly fine with her being on 50 twice daily.  Alm Clay, MD

## 2024-08-20 NOTE — Telephone Encounter (Signed)
 Called patient about Flecainide . Patient stated she has been taking Flecainide  50 mg BID for at least 10 year. [place] the Flecainide  50 mg BID. She stated the Flecainide  100 mg BID is too strong, and would like to continue on the Flecainide  50 mg BID. Will send message to Dr. Anner to change to Flecainide  50 mg BID going forward.

## 2024-08-29 ENCOUNTER — Ambulatory Visit (INDEPENDENT_AMBULATORY_CARE_PROVIDER_SITE_OTHER): Admitting: *Deleted

## 2024-08-29 VITALS — Ht 69.0 in | Wt 192.0 lb

## 2024-08-29 DIAGNOSIS — Z0000 Encounter for general adult medical examination without abnormal findings: Secondary | ICD-10-CM

## 2024-08-29 DIAGNOSIS — Z13820 Encounter for screening for osteoporosis: Secondary | ICD-10-CM

## 2024-08-29 DIAGNOSIS — Z780 Asymptomatic menopausal state: Secondary | ICD-10-CM | POA: Diagnosis not present

## 2024-08-29 NOTE — Progress Notes (Signed)
 Chief Complaint  Patient presents with   Medicare Wellness     Subjective:   Sydney Ferguson is a 73 y.o. female who presents for a Medicare Annual Wellness Visit.  I connected with  Sydney Ferguson on 08/29/24 by a audio enabled telemedicine application and verified that I am speaking with the correct person using two identifiers.  Patient Location: Home  Provider Location: Home Office  Persons Participating in Visit: Patient.  I discussed the limitations of evaluation and management by telemedicine. The patient expressed understanding and agreed to proceed.   Vital Signs: Because this visit was a virtual/telehealth visit, some criteria may be missing or patient reported. Any vitals not documented were not able to be obtained and vitals that have been documented are patient reported.      Visit info / Clinical Intake: Medicare Wellness Visit Type:: Subsequent Annual Wellness Visit Persons participating in visit and providing information:: patient Medicare Wellness Visit Mode:: Telephone If telephone:: video declined Since this visit was completed virtually, some vitals may be partially provided or unavailable. Missing vitals are due to the limitations of the virtual format.: Unable to obtain vitals - no equipment If Telephone or Video please confirm:: I connected with patient using audio/video enable telemedicine. I verified patient identity with two identifiers, discussed telehealth limitations, and patient agreed to proceed. Patient Location:: home Provider Location:: home Interpreter Needed?: No Pre-visit prep was completed: no AWV questionnaire completed by patient prior to visit?: yes Date:: 08/28/24 Living arrangements:: lives with spouse/significant other Patient's Overall Health Status Rating: good Typical amount of pain: some Does pain affect daily life?: no  Dietary Habits and Nutritional Risks How many meals a day?: 3 Eats fruit and vegetables daily?:  yes Most meals are obtained by: preparing own meals  Functional Status Activities of Daily Living (to include ambulation/medication): Independent Ambulation: Independent Medication Administration: Independent Home Management (perform basic housework or laundry): Needs assistance (comment) Manage your own finances?: yes Primary transportation is: family / friends Concerns about vision?: no *vision screening is required for WTM* Concerns about hearing?: no  Fall Screening Falls in the past year?: 1 Number of falls in past year: 0 Was there an injury with Fall?: 0 Fall Risk Category Calculator: 1 Patient Fall Risk Level: Low Fall Risk  Fall Risk Patient at Risk for Falls Due to: No Fall Risks Fall risk Follow up: Falls evaluation completed; Education provided; Falls prevention discussed  Home and Transportation Safety: All rugs have non-skid backing?: yes All stairs or steps have railings?: N/A, no stairs Grab bars in the bathtub or shower?: (!) no Have non-skid surface in bathtub or shower?: yes Good home lighting?: yes Regular seat belt use?: yes Hospital stays in the last year:: no  Cognitive Assessment Difficulty concentrating, remembering, or making decisions? : no Will 6CIT or Mini Cog be Completed: yes What year is it?: 0 points What month is it?: 0 points Give patient an address phrase to remember (5 components): Its very sunny outside today in December About what time is it?: 0 points Count backwards from 20 to 1: 0 points Say the months of the year in reverse: 0 points Repeat the address phrase from earlier: 0 points 6 CIT Score: 0 points  Advance Directives (For Healthcare) Does Patient Have a Medical Advance Directive?: Yes Does patient want to make changes to medical advance directive?: No - Patient declined Type of Advance Directive: Healthcare Power of Attorney Copy of Healthcare Power of Attorney in Chart?: No -  copy requested  Reviewed/Updated   Reviewed/Updated: Reviewed All (Medical, Surgical, Family, Medications, Allergies, Care Teams, Patient Goals); Surgical History; Family History; Medications; Allergies; Care Teams; Patient Goals; Medical History    Allergies (verified) Codeine and Metformin and related   Current Medications (verified) Outpatient Encounter Medications as of 08/29/2024  Medication Sig   ALPRAZolam  (XANAX ) 0.5 MG tablet Take 1 tablet (0.5 mg total) by mouth daily as needed for anxiety.   aspirin EC 81 MG tablet Take 81 mg by mouth daily. Swallow whole.   cetirizine (ZYRTEC) 10 MG tablet Take 10 mg by mouth daily as needed for allergies.   cyclobenzaprine (FLEXERIL) 10 MG tablet Take 10 mg by mouth at bedtime.   DULoxetine (CYMBALTA) 60 MG capsule TAKE 1 CAPSULE ONCE A DAY   empagliflozin  (JARDIANCE ) 25 MG TABS tablet Take 1 tablet (25 mg total) by mouth daily. OFFICE VISIT NEEDED FOR FURTHER REFILLS   flecainide  (TAMBOCOR ) 50 MG tablet Take 1 tablet (50 mg total) by mouth 2 (two) times daily.   glipiZIDE (GLUCOTROL) 5 MG tablet Take by mouth 2 (two) times daily before a meal.   lisinopril  (ZESTRIL ) 10 MG tablet Take 1 tablet (10 mg total) by mouth daily.   metoprolol  succinate (TOPROL -XL) 25 MG 24 hr tablet Take 0.5 tablets (12.5 mg total) by mouth daily. Please call to schedule appointment for future refills. 3rd and FINAL attempt   omeprazole (PRILOSEC) 40 MG capsule Take 40 mg by mouth daily.   rosuvastatin  (CRESTOR ) 40 MG tablet Take 1 tablet (40 mg total) by mouth daily.   Semaglutide  (RYBELSUS ) 14 MG TABS Take 14 mg by mouth daily.   No facility-administered encounter medications on file as of 08/29/2024.    History: Past Medical History:  Diagnosis Date   Allergy 1980   seasonal   Anxiety and depression    Arthritis 2005   Atrial tachycardia    Diabetes mellitus with neuropathy (HCC)    Fibromyalgia    GERD (gastroesophageal reflux disease)    History of vertebral compression fracture     approx 2015   Hypertension    Migraines    Nonmelanoma skin cancer    PAF (paroxysmal atrial fibrillation) (HCC)    s/p failed ablation 2002   Past Surgical History:  Procedure Laterality Date   ABDOMINAL HYSTERECTOMY  1980   endometriosis   ABLATION  2002   unsucessful   BREAST BIOPSY Bilateral    BREAST LUMPECTOMY     Family History  Problem Relation Age of Onset   Dementia Mother    Breast cancer Mother    Alzheimer's disease Mother    Arthritis Mother    Asthma Mother    Cancer Mother    COPD Mother    Arrhythmia Father    Neuropathy Father    Arthritis Father    Asthma Father    COPD Father    Hearing loss Father    Heart disease Father    Arthritis Sister    Hearing loss Sister    Hyperlipidemia Sister    Cancer Brother    Diabetes Brother    Depression Brother    Alcohol abuse Brother    Arthritis Brother    Cancer Brother    Alcohol abuse Son    Depression Son    Hypertension Son    Stroke Son    Social History   Occupational History   Not on file  Tobacco Use   Smoking status: Former    Current packs/day:  0.00    Average packs/day: 0.5 packs/day for 3.0 years (1.5 ttl pk-yrs)    Types: Cigarettes    Quit date: 09/28/1999    Years since quitting: 24.9   Smokeless tobacco: Never  Vaping Use   Vaping status: Never Used  Substance and Sexual Activity   Alcohol use: Yes    Alcohol/week: 14.0 standard drinks of alcohol    Types: 14 Glasses of wine per week    Comment: rare   Drug use: No   Sexual activity: Not Currently    Birth control/protection: Post-menopausal   Tobacco Counseling Counseling given: Not Answered  SDOH Screenings   Food Insecurity: No Food Insecurity (08/29/2024)  Housing: High Risk (08/29/2024)  Transportation Needs: No Transportation Needs (08/29/2024)  Utilities: Not At Risk (08/29/2024)  Alcohol Screen: Low Risk  (08/29/2024)  Depression (PHQ2-9): Medium Risk (08/29/2024)  Financial Resource Strain: Low Risk   (08/29/2024)  Physical Activity: Inactive (08/29/2024)  Social Connections: Moderately Integrated (08/29/2024)  Stress: Stress Concern Present (08/29/2024)  Tobacco Use: Medium Risk (08/29/2024)  Health Literacy: Adequate Health Literacy (08/29/2024)   See flowsheets for full screening details  Depression Screen PHQ 2 & 9 Depression Scale- Over the past 2 weeks, how often have you been bothered by any of the following problems? Little interest or pleasure in doing things: 0 Feeling down, depressed, or hopeless (PHQ Adolescent also includes...irritable): 1 PHQ-2 Total Score: 1 Trouble falling or staying asleep, or sleeping too much: 2 Feeling tired or having little energy: 2 Poor appetite or overeating (PHQ Adolescent also includes...weight loss): 0 Feeling bad about yourself - or that you are a failure or have let yourself or your family down: 0 Trouble concentrating on things, such as reading the newspaper or watching television (PHQ Adolescent also includes...like school work): 0 Moving or speaking so slowly that other people could have noticed. Or the opposite - being so fidgety or restless that you have been moving around a lot more than usual: 0 PHQ-9 Total Score: 5 If you checked off any problems, how difficult have these problems made it for you to do your work, take care of things at home, or get along with other people?: Not difficult at all     Goals Addressed             This Visit's Progress    Patient Stated       Maintain current lifestyle             Objective:    Today's Vitals   08/29/24 1052  Weight: 192 lb (87.1 kg)  Height: 5' 9 (1.753 m)   Body mass index is 28.35 kg/m.  Hearing/Vision screen Hearing Screening - Comments:: No trouble hearing tinnitus Vision Screening - Comments:: Up to date digby Immunizations and Health Maintenance Health Maintenance  Topic Date Due   OPHTHALMOLOGY EXAM  Never done   Hepatitis C Screening  Never done    Zoster Vaccines- Shingrix (1 of 2) Never done   Colonoscopy  Never done   Bone Density Scan  Never done   Influenza Vaccine  04/27/2024   COVID-19 Vaccine (4 - 2025-26 season) 05/28/2024   Mammogram  10/26/2024   HEMOGLOBIN A1C  10/21/2024   Diabetic kidney evaluation - eGFR measurement  04/20/2025   Diabetic kidney evaluation - Urine ACR  04/20/2025   FOOT EXAM  04/20/2025   Medicare Annual Wellness (AWV)  08/29/2025   DTaP/Tdap/Td (2 - Td or Tdap) 05/14/2026   Pneumococcal Vaccine: 50+ Years  Completed   Meningococcal B Vaccine  Aged Out        Assessment/Plan:  This is a routine wellness examination for Semmes.  Patient Care Team: Candise Aleene DEL, MD as PCP - General (Family Medicine) Derry Mabel MANIFOLD, MD (Cardiology)  I have personally reviewed and noted the following in the patient's chart:   Medical and social history Use of alcohol, tobacco or illicit drugs  Current medications and supplements including opioid prescriptions. Functional ability and status Nutritional status Physical activity Advanced directives List of other physicians Hospitalizations, surgeries, and ER visits in previous 12 months Vitals Screenings to include cognitive, depression, and falls Referrals and appointments  No orders of the defined types were placed in this encounter.  In addition, I have reviewed and discussed with patient certain preventive protocols, quality metrics, and best practice recommendations. A written personalized care plan for preventive services as well as general preventive health recommendations were provided to patient.   Eythan Jayne, LPN   [id number]   Return in 1 year (on 08/29/2025).  After Visit Summary: (MyChart) Due to this being a telephonic visit, the after visit summary with patients personalized plan was offered to patient via MyChart   Nurse Notes:

## 2024-08-29 NOTE — Patient Instructions (Signed)
 Sydney Ferguson,  Thank you for taking the time for your Medicare Wellness Visit. I appreciate your continued commitment to your health goals. Please review the care plan we discussed, and feel free to reach out if I can assist you further.  Please note that Annual Wellness Visits do not include a physical exam. Some assessments may be limited, especially if the visit was conducted virtually. If needed, we may recommend an in-person follow-up with your provider.  Ongoing Care Seeing your primary care provider every 3 to 6 months helps us  monitor your health and provide consistent, personalized care.   Referrals If a referral was made during today's visit and you haven't received any updates within two weeks, please contact the referred provider directly to check on the status.  Recommended Screenings:  Health Maintenance  Topic Date Due   Eye exam for diabetics  Never done   Hepatitis C Screening  Never done   Zoster (Shingles) Vaccine (1 of 2) Never done   Colon Cancer Screening  Never done   Osteoporosis screening with Bone Density Scan  Never done   Flu Shot  04/27/2024   COVID-19 Vaccine (4 - 2025-26 season) 05/28/2024   Breast Cancer Screening  10/26/2024   Hemoglobin A1C  10/21/2024   Yearly kidney function blood test for diabetes  04/20/2025   Yearly kidney health urinalysis for diabetes  04/20/2025   Complete foot exam   04/20/2025   Medicare Annual Wellness Visit  08/29/2025   DTaP/Tdap/Td vaccine (2 - Td or Tdap) 05/14/2026   Pneumococcal Vaccine for age over 86  Completed   Meningitis B Vaccine  Aged Out       08/29/2024   10:54 AM  Advanced Directives  Does Patient Have a Medical Advance Directive? Yes  Type of Advance Directive Healthcare Power of Attorney  Does patient want to make changes to medical advance directive? No - Patient declined  Copy of Healthcare Power of Attorney in Chart? No - copy requested    Vision: Annual vision screenings are recommended for  early detection of glaucoma, cataracts, and diabetic retinopathy. These exams can also reveal signs of chronic conditions such as diabetes and high blood pressure.  Dental: Annual dental screenings help detect early signs of oral cancer, gum disease, and other conditions linked to overall health, including heart disease and diabetes.  Please see the attached documents for additional preventive care recommendations.    Sydney Ferguson , Thank you for taking time to come for your Medicare Wellness Visit. I appreciate your ongoing commitment to your health goals. Please review the following plan we discussed and let me know if I can assist you in the future.   Screening recommendations/referrals: Colonoscopy:  Mammogram:  Bone Density:  Recommended yearly ophthalmology/optometry visit for glaucoma screening and checkup Recommended yearly dental visit for hygiene and checkup  Vaccinations: Influenza vaccine:  Pneumococcal vaccine:  Tdap vaccine:  Shingles vaccine:       Preventive Care 65 Years and Older, Female Preventive care refers to lifestyle choices and visits with your health care provider that can promote health and wellness. What does preventive care include? A yearly physical exam. This is also called an annual well check. Dental exams once or twice a year. Routine eye exams. Ask your health care provider how often you should have your eyes checked. Personal lifestyle choices, including: Daily care of your teeth and gums. Regular physical activity. Eating a healthy diet. Avoiding tobacco and drug use. Limiting alcohol use. Practicing safe  sex. Taking low-dose aspirin every day. Taking vitamin and mineral supplements as recommended by your health care provider. What happens during an annual well check? The services and screenings done by your health care provider during your annual well check will depend on your age, overall health, lifestyle risk factors, and family  history of disease. Counseling  Your health care provider may ask you questions about your: Alcohol use. Tobacco use. Drug use. Emotional well-being. Home and relationship well-being. Sexual activity. Eating habits. History of falls. Memory and ability to understand (cognition). Work and work astronomer. Reproductive health. Screening  You may have the following tests or measurements: Height, weight, and BMI. Blood pressure. Lipid and cholesterol levels. These may be checked every 5 years, or more frequently if you are over 74 years old. Skin check. Lung cancer screening. You may have this screening every year starting at age 45 if you have a 30-pack-year history of smoking and currently smoke or have quit within the past 15 years. Fecal occult blood test (FOBT) of the stool. You may have this test every year starting at age 57. Flexible sigmoidoscopy or colonoscopy. You may have a sigmoidoscopy every 5 years or a colonoscopy every 10 years starting at age 26. Hepatitis C blood test. Hepatitis B blood test. Sexually transmitted disease (STD) testing. Diabetes screening. This is done by checking your blood sugar (glucose) after you have not eaten for a while (fasting). You may have this done every 1-3 years. Bone density scan. This is done to screen for osteoporosis. You may have this done starting at age 27. Mammogram. This may be done every 1-2 years. Talk to your health care provider about how often you should have regular mammograms. Talk with your health care provider about your test results, treatment options, and if necessary, the need for more tests. Vaccines  Your health care provider may recommend certain vaccines, such as: Influenza vaccine. This is recommended every year. Tetanus, diphtheria, and acellular pertussis (Tdap, Td) vaccine. You may need a Td booster every 10 years. Zoster vaccine. You may need this after age 51. Pneumococcal 13-valent conjugate (PCV13)  vaccine. One dose is recommended after age 74. Pneumococcal polysaccharide (PPSV23) vaccine. One dose is recommended after age 5. Talk to your health care provider about which screenings and vaccines you need and how often you need them. This information is not intended to replace advice given to you by your health care provider. Make sure you discuss any questions you have with your health care provider. Document Released: 10/10/2015 Document Revised: 06/02/2016 Document Reviewed: 07/15/2015 Elsevier Interactive Patient Education  2017 [employer].  Fall Prevention in the Home Falls can cause injuries. They can happen to people of all ages. There are many things you can do to make your home safe and to help prevent falls. What can I do on the outside of my home? Regularly fix the edges of walkways and driveways and fix any cracks. Remove anything that might make you trip as you walk through a door, such as a raised step or threshold. Trim any bushes or trees on the path to your home. Use bright outdoor lighting. Clear any walking paths of anything that might make someone trip, such as rocks or tools. Regularly check to see if handrails are loose or broken. Make sure that both sides of any steps have handrails. Any raised decks and porches should have guardrails on the edges. Have any leaves, snow, or ice cleared regularly. Use sand or salt on  walking paths during winter. Clean up any spills in your garage right away. This includes oil or grease spills. What can I do in the bathroom? Use night lights. Install grab bars by the toilet and in the tub and shower. Do not use towel bars as grab bars. Use non-skid mats or decals in the tub or shower. If you need to sit down in the shower, use a plastic, non-slip stool. Keep the floor dry. Clean up any water that spills on the floor as soon as it happens. Remove soap buildup in the tub or shower regularly. Attach bath mats securely with  double-sided non-slip rug tape. Do not have throw rugs and other things on the floor that can make you trip. What can I do in the bedroom? Use night lights. Make sure that you have a light by your bed that is easy to reach. Do not use any sheets or blankets that are too big for your bed. They should not hang down onto the floor. Have a firm chair that has side arms. You can use this for support while you get dressed. Do not have throw rugs and other things on the floor that can make you trip. What can I do in the kitchen? Clean up any spills right away. Avoid walking on wet floors. Keep items that you use a lot in easy-to-reach places. If you need to reach something above you, use a strong step stool that has a grab bar. Keep electrical cords out of the way. Do not use floor polish or wax that makes floors slippery. If you must use wax, use non-skid floor wax. Do not have throw rugs and other things on the floor that can make you trip. What can I do with my stairs? Do not leave any items on the stairs. Make sure that there are handrails on both sides of the stairs and use them. Fix handrails that are broken or loose. Make sure that handrails are as long as the stairways. Check any carpeting to make sure that it is firmly attached to the stairs. Fix any carpet that is loose or worn. Avoid having throw rugs at the top or bottom of the stairs. If you do have throw rugs, attach them to the floor with carpet tape. Make sure that you have a light switch at the top of the stairs and the bottom of the stairs. If you do not have them, ask someone to add them for you. What else can I do to help prevent falls? Wear shoes that: Do not have high heels. Have rubber bottoms. Are comfortable and fit you well. Are closed at the toe. Do not wear sandals. If you use a stepladder: Make sure that it is fully opened. Do not climb a closed stepladder. Make sure that both sides of the stepladder are locked  into place. Ask someone to hold it for you, if possible. Clearly mark and make sure that you can see: Any grab bars or handrails. First and last steps. Where the edge of each step is. Use tools that help you move around (mobility aids) if they are needed. These include: Canes. Walkers. Scooters. Crutches. Turn on the lights when you go into a dark area. Replace any light bulbs as soon as they burn out. Set up your furniture so you have a clear path. Avoid moving your furniture around. If any of your floors are uneven, fix them. If there are any pets around you, be aware of where they  are. Review your medicines with your doctor. Some medicines can make you feel dizzy. This can increase your chance of falling. Ask your doctor what other things that you can do to help prevent falls. This information is not intended to replace advice given to you by your health care provider. Make sure you discuss any questions you have with your health care provider. Document Released: 07/10/2009 Document Revised: 02/19/2016 Document Reviewed: 10/18/2014 Elsevier Interactive Patient Education  2017 [employer].

## 2024-08-31 ENCOUNTER — Other Ambulatory Visit: Payer: Self-pay | Admitting: Family Medicine

## 2024-08-31 DIAGNOSIS — E119 Type 2 diabetes mellitus without complications: Secondary | ICD-10-CM

## 2024-08-31 NOTE — Telephone Encounter (Signed)
 Copied from CRM [id number]. Topic: Clinical - Medication Refill >> Aug 31, 2024 11:38 AM Thersia C wrote: Medication: Semaglutide  (RYBELSUS ) 14 MG TABS omeprazole (PRILOSEC) 40 MG capsule   Has the patient contacted their pharmacy? Yes (Agent: If no, request that the patient contact the pharmacy for the refill. If patient does not wish to contact the pharmacy document the reason why and proceed with request.) (Agent: If yes, when and what did the pharmacy advise?)  This is the patient's preferred pharmacy:  [facility] [phone] - [place], [place] - [address] [place] [address] Phone: [phone] Fax: [phone]  Is this the correct pharmacy for this prescription? Yes If no, delete pharmacy and type the correct one.   Has the prescription been filled recently? No  Is the patient out of the medication? Yes  Has the patient been seen for an appointment in the last year OR does the patient have an upcoming appointment? Yes  Can we respond through MyChart? Yes  Agent: Please be advised that Rx refills may take up to 3 business days. We ask that you follow-up with your pharmacy.

## 2024-08-31 NOTE — Telephone Encounter (Signed)
 RF request for Rybelsus  LOV: 04/20/24 Next ov: 09/06/24 Last written: 07/11/20 Dr.Ellison  RF request for Omeprazole LOV: 04/20/24 Next ov: 09/06/24 Last written: 05/24/17, historical provider  Requesting: alprazolam  Contract: N/A UDS: N/A Last Visit: 04/20/24 Next Visit: 09/06/24 Last Refill: 07/20/24 (30,1)

## 2024-08-31 NOTE — Telephone Encounter (Signed)
 Copied from CRM [id number]. Topic: Clinical - Medication Refill >> Aug 31, 2024 11:38 AM Thersia C wrote: Medication: Semaglutide  (RYBELSUS ) 14 MG TABS omeprazole (PRILOSEC) 40 MG capsule   Has the patient contacted their pharmacy? Yes (Agent: If no, request that the patient contact the pharmacy for the refill. If patient does not wish to contact the pharmacy document the reason why and proceed with request.) (Agent: If yes, when and what did the pharmacy advise?)  This is the patient's preferred pharmacy:  [phone] - [place], [place] - [address] [place] [address] Phone: [phone] Fax: [phone]  Is this the correct pharmacy for this prescription? Yes If no, delete pharmacy and type the correct one.   Has the prescription been filled recently? No  Is the patient out of the medication? Yes  Has the patient been seen for an appointment in the last year OR does the patient have an upcoming appointment? Yes  Can we respond through MyChart? Yes  Agent: Please be advised that Rx refills may take up to 3 business days. We ask that you follow-up with your pharmacy. >> Aug 31, 2024 11:40 AM Thersia C wrote: Patient called in is requesting this prescription to be refilled as well ALPRAZolam  (XANAX ) 0.5 MG tablet

## 2024-09-02 MED ORDER — ALPRAZOLAM 0.5 MG PO TABS: 0.5000 mg | ORAL_TABLET | Freq: Every day | ORAL | 1 refills | Status: DC | PRN

## 2024-09-02 MED ORDER — OMEPRAZOLE 40 MG PO CPDR: 40.0000 mg | DELAYED_RELEASE_CAPSULE | Freq: Every day | ORAL | 3 refills | Status: AC

## 2024-09-02 MED ORDER — RYBELSUS 14 MG PO TABS: 14.0000 mg | ORAL_TABLET | Freq: Every day | ORAL | 3 refills | Status: AC

## 2024-09-05 ENCOUNTER — Other Ambulatory Visit: Payer: Self-pay | Admitting: Family Medicine

## 2024-09-06 ENCOUNTER — Ambulatory Visit: Admitting: Family Medicine

## 2024-09-06 ENCOUNTER — Encounter: Payer: Self-pay | Admitting: Family Medicine

## 2024-09-06 VITALS — BP 134/85 | HR 88 | Temp 97.3°F | Ht 69.0 in | Wt 191.4 lb

## 2024-09-06 DIAGNOSIS — I10 Essential (primary) hypertension: Secondary | ICD-10-CM

## 2024-09-06 DIAGNOSIS — E119 Type 2 diabetes mellitus without complications: Secondary | ICD-10-CM | POA: Diagnosis not present

## 2024-09-06 DIAGNOSIS — Z7984 Long term (current) use of oral hypoglycemic drugs: Secondary | ICD-10-CM

## 2024-09-06 DIAGNOSIS — E1142 Type 2 diabetes mellitus with diabetic polyneuropathy: Secondary | ICD-10-CM | POA: Diagnosis not present

## 2024-09-06 DIAGNOSIS — R809 Proteinuria, unspecified: Secondary | ICD-10-CM

## 2024-09-06 DIAGNOSIS — E7800 Pure hypercholesterolemia, unspecified: Secondary | ICD-10-CM | POA: Diagnosis not present

## 2024-09-06 LAB — POCT GLYCOSYLATED HEMOGLOBIN (HGB A1C)
HbA1c POC (<> result, manual entry): 6.4 % (ref 4.0–5.6)
HbA1c, POC (controlled diabetic range): 6.4 % (ref 0.0–7.0)
HbA1c, POC (prediabetic range): 6.4 % (ref 5.7–6.4)
Hemoglobin A1C: 6.4 % — AB (ref 4.0–5.6)

## 2024-09-06 MED ORDER — ALPRAZOLAM 0.5 MG PO TABS: 0.5000 mg | ORAL_TABLET | Freq: Every day | ORAL | 5 refills | Status: AC | PRN

## 2024-09-06 NOTE — Patient Instructions (Addendum)
 It was very nice to see you today! Your A1c was 6.4  Do not take your morning glipizide if you anticipate a busy day in which you may not very well.  Decrease your evening glipizide to a half tab or you could even try not taking it at all.       PLEASE NOTE:  If labs were collected or images ordered, we will inform you of  results once we have received them and reviewed. We will contact you either by echart message, or telephone call.  Please give ample time to the testing facility, and our office to run,  receive and review results. Please do not call inquiring of results, even if you can see them in your chart. We will contact you as soon as we are able. If it has been over 1 week since the test was completed, and you have not yet heard from us , then please call us .     - echart message- for normal results that have been seen by the patient already.   - telephone call: abnormal results or if patient has not viewed results in their echart.   If a referral to a specialist was entered for you, please call us  in 2 weeks if you have not heard from the specialist office to schedule.

## 2024-09-06 NOTE — Progress Notes (Signed)
 OFFICE VISIT  09/06/2024  CC:  Chief Complaint  Patient presents with   Medical Management of Chronic Issues    Pt not fasting    Patient is a 73 y.o. female who presents for 79-month follow-up diabetes, hypertension, GAD, and hypercholesterolemia. A/P as of last visit: #1 diabetes with peripheral neuropathy. Feet exam normal today. Monitor hemoglobin A1c, serum creatinine, and urine microalbumin/creatinine today. Continue Rybelsus  14 mg a day , jardiance  25mg  every day, and glipizide 5 mg twice daily. Continue Cymbalta 60 mg a day.   #2 hypertension, well-controlled on Toprol -XL 12.5 mg a day and lisinopril  10 mg a day.   #3 hypercholesterolemia, currently taking rosuvastatin  40 mg a day. Not fasting. Check fasting lipid panel at next follow-up visit.   4.  GAD. Continue 60 mg Cymbalta daily. She takes alprazolam  rarely.   #5 PAF. She has declined anticoagulation. She is on flecainide  and Toprol . She has a cardiologist in [place]    INTERIM HX: Sydney Ferguson is doing pretty good. Diet is improving.   Over the last month or so she does note some periods during which she suspects low blood sugar.  She has some tremulousness, little bit of cognitive clouding and generalized weakness.  These symptoms pass in a few minutes.  The episodes occur a couple of days per week usually. She checked her glucose shortly after one of the episodes and it was 83. She has not checked her blood pressure during or after one of the episodes.  She does have a history of orthostatic dizziness/syncope but says this feels significantly different than that.  She has had bilateral hand tremor over the last several years, mild, not progressing. No bradykinesia, stiffness, or difficulty walking.  PMP AWARE reviewed today: most recent rx for alprazolam  was filled 09/02/2024, # 30, rx by me. No red flags.   Past Medical History:  Diagnosis Date   Allergy 1980   seasonal   Anxiety and depression     Arthritis 2005   Atrial tachycardia    Diabetes mellitus with neuropathy (HCC)    Fibromyalgia    GERD (gastroesophageal reflux disease)    History of vertebral compression fracture    approx 2015   Hypertension 1993   Migraines    Nonmelanoma skin cancer    PAF (paroxysmal atrial fibrillation) (HCC)    s/p failed ablation 2002    Past Surgical History:  Procedure Laterality Date   ABDOMINAL HYSTERECTOMY  1980   endometriosis   ABLATION  2002   unsucessful   BREAST BIOPSY Bilateral    BREAST LUMPECTOMY      Outpatient Medications Prior to Visit  Medication Sig Dispense Refill   ALPRAZolam  (XANAX ) 0.5 MG tablet Take 1 tablet (0.5 mg total) by mouth daily as needed for anxiety. 30 tablet 1   aspirin EC 81 MG tablet Take 81 mg by mouth daily. Swallow whole.     cetirizine (ZYRTEC) 10 MG tablet Take 10 mg by mouth daily as needed for allergies.     cyclobenzaprine (FLEXERIL) 10 MG tablet Take 10 mg by mouth at bedtime.     DULoxetine (CYMBALTA) 60 MG capsule TAKE 1 CAPSULE ONCE A DAY  3   empagliflozin  (JARDIANCE ) 25 MG TABS tablet Take 1 tablet (25 mg total) by mouth daily. OFFICE VISIT NEEDED FOR FURTHER REFILLS 30 tablet 0   flecainide  (TAMBOCOR ) 50 MG tablet Take 1 tablet (50 mg total) by mouth 2 (two) times daily. 180 tablet 3   glipiZIDE (GLUCOTROL) 5  MG tablet Take by mouth 2 (two) times daily before a meal.     lisinopril  (ZESTRIL ) 10 MG tablet Take 1 tablet (10 mg total) by mouth daily. 90 tablet 3   metoprolol  succinate (TOPROL -XL) 25 MG 24 hr tablet Take 0.5 tablets (12.5 mg total) by mouth daily. Please call to schedule appointment for future refills. 3rd and FINAL attempt 45 tablet 3   omeprazole  (PRILOSEC) 40 MG capsule Take 1 capsule (40 mg total) by mouth daily. 90 capsule 3   rosuvastatin  (CRESTOR ) 40 MG tablet Take 1 tablet (40 mg total) by mouth daily. 90 tablet 3   Semaglutide  (RYBELSUS ) 14 MG TABS Take 1 tablet (14 mg total) by mouth daily. 90 tablet 3   No  facility-administered medications prior to visit.    Allergies[1]  Review of Systems As per HPI  PE:    09/06/2024    1:59 PM 08/29/2024   10:52 AM 08/15/2024    2:29 PM  Vitals with BMI  Height 5' 9 5' 9 5' 9.5  Weight 191 lbs 6 oz 192 lbs 192 lbs 6 oz  BMI 28.25 28.34 28.01  Systolic 134  114  Diastolic 85  60  Pulse 88  78     Physical Exam  Gen: Alert, well appearing.  Patient is oriented to person, place, time, and situation. AFFECT: pleasant, lucid thought and speech. Neuro: CN 2-12 intact bilaterally, strength 5/5 in proximal and distal upper extremities and lower extremities bilaterally.  No intention tremor.  He has a mild voice tremor.  She also has bilateral hand tremor with arms held outstretched, pretty mild.  No ataxia, no postural instability.  Upper extremity and lower extremity DTRs symmetric.  No pronator drift.   LABS:   Last metabolic panel Lab Results  Component Value Date   GLUCOSE 90 04/20/2024   NA 139 04/20/2024   K 3.9 04/20/2024   CL 99 04/20/2024   CO2 28 04/20/2024   BUN 18 04/20/2024   CREATININE 0.61 04/20/2024   EGFR 95 04/20/2024   CALCIUM  10.3 04/20/2024   PROT 7.4 04/20/2024   BILITOT 0.5 04/20/2024   AST 23 04/20/2024   ALT 34 (H) 04/20/2024   Last hemoglobin A1c Lab Results  Component Value Date   HGBA1C 7.3 (H) 04/20/2024   Last thyroid  functions Lab Results  Component Value Date   TSH 1.55 08/04/2017   Last vitamin B12 and Folate Lab Results  Component Value Date   VITAMINB12 245 08/04/2017   IMPRESSION AND PLAN:  1 diabetes with peripheral neuropathy. Hemoglobin A1c is improved to 6.4% today.  She describes a few episodes that sound like hypoglycemia. The plan is for her to hold her morning glipizide dose if she knows she will be busy and not eat well during the day and she will either take a half of glipizide with her evening meal or leave it out altogether. Continue Rybelsus  14 mg a day , jardiance  25mg   every day. Continue Cymbalta 60 mg a day.   She had just a tiny increase in her urine microalbumin/creatinine ratio at last check a few months ago.  Will repeat today. We have room to go up on her lisinopril  if needed.  #2 hypertension, well-controlled on Toprol -XL 12.5 mg a day and lisinopril  10 mg a day. Monitor electrolytes and creatinine today.   #3 hypercholesterolemia, currently taking rosuvastatin  40 mg a day. Fasting lipid panel today (she has not eaten in at least 8 hours).   4.  GAD--> with lots of added stress over the last 1 year or so. Continue 60 mg Cymbalta daily. She takes alprazolam  only occasionally. Controlled substance contract today.   #5 PAF. She has declined anticoagulation. She is on flecainide  and Toprol . She has a cardiologist in [place]   An After Visit Summary was printed and given to the patient.  FOLLOW UP: No follow-ups on file. Next CPE July/August 2026 Signed:  Gerlene Hockey, MD           09/06/2024      [1]  Allergies Allergen Reactions   Codeine Nausea Only   Metformin And Related Nausea Only

## 2024-09-07 LAB — BASIC METABOLIC PANEL WITH GFR
BUN: 20 mg/dL (ref 6–23)
CO2: 29 meq/L (ref 19–32)
Calcium: 10.2 mg/dL (ref 8.4–10.5)
Chloride: 101 meq/L (ref 96–112)
Creatinine, Ser: 0.63 mg/dL (ref 0.40–1.20)
GFR: 88.42 mL/min (ref >60.00)
Glucose, Bld: 98 mg/dL (ref 70–99)
Potassium: 4.3 meq/L (ref 3.5–5.1)
Sodium: 141 meq/L (ref 135–145)

## 2024-09-07 LAB — LIPID PANEL
Cholesterol: 170 mg/dL (ref 0–200)
HDL: 66 mg/dL (ref >39.00)
LDL Cholesterol: 71 mg/dL (ref 0–99)
NonHDL: 103.85
Total CHOL/HDL Ratio: 3
Triglycerides: 165 mg/dL — ABNORMAL HIGH (ref 0.0–149.0)
VLDL: 33 mg/dL (ref 0.0–40.0)

## 2024-09-07 LAB — MICROALBUMIN / CREATININE URINE RATIO
Creatinine,U: 59.7 mg/dL
Microalb Creat Ratio: 24.5 mg/g (ref 0.0–30.0)
Microalb, Ur: 1.5 mg/dL (ref 0.0–1.9)

## 2024-09-09 ENCOUNTER — Ambulatory Visit: Payer: Self-pay | Admitting: Family Medicine

## 2024-10-15 ENCOUNTER — Other Ambulatory Visit: Payer: Self-pay

## 2024-10-15 MED ORDER — CYCLOBENZAPRINE HCL 10 MG PO TABS: 10.0000 mg | ORAL_TABLET | Freq: Every day | ORAL | 3 refills | Status: AC

## 2024-10-15 NOTE — Telephone Encounter (Signed)
 RF request for flexeril  LOV: 09/06/24 Next ov: 12/05/24 Last written: historical provider 04/20/24  Please fill, if appropriate. Rx pending

## 2024-10-16 ENCOUNTER — Telehealth: Payer: Self-pay

## 2024-10-16 ENCOUNTER — Ambulatory Visit (HOSPITAL_BASED_OUTPATIENT_CLINIC_OR_DEPARTMENT_OTHER)
Admission: RE | Admit: 2024-10-16 | Discharge: 2024-10-16 | Disposition: A | Discharge status: Home / Self Care | Source: Ambulatory Visit | Attending: Family Medicine | Admitting: Family Medicine

## 2024-10-16 DIAGNOSIS — Z780 Asymptomatic menopausal state: Secondary | ICD-10-CM | POA: Insufficient documentation

## 2024-10-16 NOTE — Telephone Encounter (Signed)
 SABRA

## 2024-10-17 ENCOUNTER — Encounter: Payer: Self-pay | Admitting: Family Medicine

## 2024-10-17 ENCOUNTER — Other Ambulatory Visit (HOSPITAL_COMMUNITY): Payer: Self-pay

## 2024-10-17 ENCOUNTER — Ambulatory Visit: Payer: Self-pay | Admitting: Family Medicine

## 2024-10-17 NOTE — Telephone Encounter (Signed)
 Pharmacy Patient Advocate Encounter  Received notification from [employer] that Prior Authorizationn: Cyclobenzapine 10 mg} has been CANCELLED due to PA already approved.

## 2024-12-05 ENCOUNTER — Ambulatory Visit: Admitting: Family Medicine
